# Patient Record
Sex: Female | Born: 1985 | Race: White | Hispanic: No | Smoking: Never smoker
Health system: Southern US, Community
[De-identification: ages and names within clinical notes are randomized; demographics above are authoritative.]

## PROBLEM LIST (undated history)

## (undated) DIAGNOSIS — M069 Rheumatoid arthritis, unspecified: Secondary | ICD-10-CM

## (undated) DIAGNOSIS — T7840XA Allergy, unspecified, initial encounter: Secondary | ICD-10-CM

## (undated) DIAGNOSIS — L4050 Arthropathic psoriasis, unspecified: Secondary | ICD-10-CM

## (undated) DIAGNOSIS — F32A Depression, unspecified: Secondary | ICD-10-CM

## (undated) DIAGNOSIS — K219 Gastro-esophageal reflux disease without esophagitis: Secondary | ICD-10-CM

## (undated) DIAGNOSIS — F419 Anxiety disorder, unspecified: Secondary | ICD-10-CM

## (undated) DIAGNOSIS — R7303 Prediabetes: Secondary | ICD-10-CM

## (undated) HISTORY — DX: Gastro-esophageal reflux disease without esophagitis: K21.9

## (undated) HISTORY — DX: Allergy, unspecified, initial encounter: T78.40XA

## (undated) HISTORY — PX: COSMETIC SURGERY: SHX468

## (undated) HISTORY — DX: Anxiety disorder, unspecified: F41.9

## (undated) HISTORY — DX: Arthropathic psoriasis, unspecified: L40.50

## (undated) HISTORY — DX: Prediabetes: R73.03

## (undated) HISTORY — DX: Rheumatoid arthritis, unspecified: M06.9

## (undated) HISTORY — DX: Depression, unspecified: F32.A

## (undated) HISTORY — PX: LEEP: SHX91

## (undated) HISTORY — PX: COLPOSCOPY: SHX161

---
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
## 2004-06-06 ENCOUNTER — Other Ambulatory Visit: Admission: RE | Admit: 2004-06-06 | Discharge: 2004-06-06 | Payer: Self-pay | Admitting: Obstetrics and Gynecology

## 2005-08-14 ENCOUNTER — Other Ambulatory Visit: Admission: RE | Admit: 2005-08-14 | Discharge: 2005-08-14 | Payer: Self-pay | Admitting: Obstetrics and Gynecology

## 2006-05-28 HISTORY — PX: LAPAROSCOPY: SHX197

## 2006-08-20 ENCOUNTER — Other Ambulatory Visit: Admission: RE | Admit: 2006-08-20 | Discharge: 2006-08-20 | Payer: Self-pay | Admitting: Obstetrics and Gynecology

## 2007-04-15 ENCOUNTER — Encounter: Admission: RE | Admit: 2007-04-15 | Discharge: 2007-04-15 | Payer: Self-pay | Admitting: Family Medicine

## 2009-04-12 ENCOUNTER — Ambulatory Visit (HOSPITAL_COMMUNITY): Admission: RE | Admit: 2009-04-12 | Discharge: 2009-04-12 | Payer: Self-pay | Admitting: Obstetrics and Gynecology

## 2010-04-12 ENCOUNTER — Inpatient Hospital Stay (HOSPITAL_COMMUNITY): Admission: AD | Admit: 2010-04-12 | Discharge: 2010-04-15 | Payer: Self-pay | Admitting: Obstetrics and Gynecology

## 2010-04-15 ENCOUNTER — Encounter
Admission: RE | Admit: 2010-04-15 | Discharge: 2010-05-10 | Payer: Self-pay | Source: Home / Self Care | Attending: Obstetrics and Gynecology | Admitting: Obstetrics and Gynecology

## 2010-08-08 LAB — CBC
HCT: 35 % — ABNORMAL LOW (ref 36.0–46.0)
Hemoglobin: 8.6 g/dL — ABNORMAL LOW (ref 12.0–15.0)
MCH: 33.7 pg (ref 26.0–34.0)
MCV: 97.7 fL (ref 78.0–100.0)
MCV: 98.7 fL (ref 78.0–100.0)
Platelets: 154 10*3/uL (ref 150–400)
Platelets: 216 10*3/uL (ref 150–400)
RBC: 2.55 MIL/uL — ABNORMAL LOW (ref 3.87–5.11)
RBC: 3.58 MIL/uL — ABNORMAL LOW (ref 3.87–5.11)
WBC: 10.8 10*3/uL — ABNORMAL HIGH (ref 4.0–10.5)
WBC: 7.9 10*3/uL (ref 4.0–10.5)

## 2010-08-08 LAB — AMNISURE RUPTURE OF MEMBRANE (ROM) NOT AT ARMC: Amnisure ROM: POSITIVE

## 2010-08-30 LAB — CBC
HCT: 39 % (ref 36.0–46.0)
Hemoglobin: 13.2 g/dL (ref 12.0–15.0)
MCV: 96.5 fL (ref 78.0–100.0)
Platelets: 355 10*3/uL (ref 150–400)
WBC: 6.5 10*3/uL (ref 4.0–10.5)

## 2010-10-13 NOTE — Op Note (Signed)
NAMESUMIYA, Rhonda Baker                       ACCOUNT NO.:  [id number]   MEDICAL RECORD NO.:  [id number]                   PATIENT TYPE:  AMB   LOCATION:  SDC                                  FACILITY:  [facility]   PHYSICIAN:  Georgina Peer, M.D.              DATE OF BIRTH:  04/18/1986   DATE OF PROCEDURE:  09/11/2002  DATE OF DISCHARGE:                                 OPERATIVE REPORT   PREOPERATIVE DIAGNOSES:  1. Left lower quadrant pain  2. Left ovarian cyst.   POSTOPERATIVE DIAGNOSES:  1. Left simple ovarian cyst.  2. 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   OPERATION PERFORMED:  1. Diagnostic laparoscopy with biopsy of peritoneum.  2. Fulguration of peritoneal endometriosis.  3. Left ovarian cystotomy.   SURGEON:  Georgina Peer, M.D.   ANESTHESIA:  General.   ANESTHESIOLOGIST:  Raul Del, M.D.   ESTIMATED BLOOD LOSS:  Less than 25 cc.   FINDINGS:  Simple left ovarian cyst, 2 cm in diameter with clear fluid in  it.  Implants of endometriosis at  cul-de-sac and pelvic peritoneum.   INDICATIONS:  An 25 year old gravida 0 with left lower quadrant pain.  A  left ovarian cyst, which was resolving based on ultrasound, but continued  pelvic pain.  She is brought in for evaluation.  After informed consent,  understanding the risks and complications of laparoscopy including:  bowel,  bladder or vascular injury, normal findings which would not explain her  pain, anesthetic complications  and skin and wound infections; she accepted  these and was willing to proceed.   DESCRIPTION OF PROCEDURE:  The patient was taken to the operating room,  placed in the dorsal lithotomy position, given a general anesthetic by Dr.  Tacy Dura.  Her abdomen, perineum and vagina were prepped and draped in the  normal sterile fashion.  She then had her bladder emptied with a catheter.   Bimanual examination revealed an anterior uterus with no adnexal masses.  A  uterine manipulator was placed  into the cervix, and other instruments  removed.  A vertical subumbilical incision was made and a suprapubic  incision was made, after 2 cc of 0.25% Marcaine was injected into the skin.  After the incisions were made a Veress needle was placed into the abdominal  peritoneal cavity, and 3 L of carbon dioxide gas was insufflated under low  pressures -- creating a pneumoperitoneum.  Laparoscopic trocar and sleeve  were then introduced, and under direct vision a 5 mm suprapubic port was  introduced through that incision suprapubicly.   The following pelvic findings were noted.  There appeared to be no injury  from the laparoscopic trocar placement.  The upper abdomen and appendix  appeared normal.  The liver and diaphragm appeared normal.  The colon and  small intestine appeared normal.  The anterior surface and bladder flap of  the uterus appeared normal.  Tubes bilaterally appeared normal.  The right  ovary appeared normal.  The right ovarian fossae appeared normal.  There  were implants of endometriosis in various stages, from powder burn spots to  red blebs to clear blebs, around and between the uterosacral ligaments and  cul-de-sac.  There was some scarring in the cul-de-sac and retraction of it.  The left ovary contained a 2 cm simple-appearing cyst, but was not adherent  to any of the structures.  A biopsy was taken of endometriosis in the cul-de-  sac on the right uterosacral ligament.  Then the harmonic scalpel with the  dissecting hook was used to ablate all endometrial implants, powder burn  spots, clear and red blebs, scarring of the peritoneum.  The peritoneum was  elevated over the ureters by hydrodissection, and was freed from the  ureters.  The uterosacral ligaments were cauterized and the stripes issue  around the uterosacral ligaments was divided.  A cystotomy of the left  ovarian cyst was performed with a harmonic scalpel, and clear fluid egressed  from the cyst.  The cyst  was completely deflated.  At this point photo  documentation was made; 10 cc of 0.25% Marcaine was placed into the cul-de-  sac over the dissected area.  Sponge, needle and instrument counts were  correct.  The ports were removed.  The skin incisions were closed with 4-0  Dexon.  The patient received Toradol IV and was returned to the recovery  area in stable condition.                                               Georgina Peer, M.D.    JPN/MEDQ  D:  09/11/2002  T:  09/12/2002  Job:  [id number]   cc:   Bryan Lemma. Manus Gunning, M.D.  [address] [place]  [place] [address]  Fax: [phone]

## 2019-09-15 ENCOUNTER — Emergency Department (HOSPITAL_COMMUNITY)
Admission: EM | Admit: 2019-09-15 | Discharge: 2019-09-16 | Disposition: A | Discharge status: Home / Self Care | Payer: 59 | Attending: Emergency Medicine | Admitting: Emergency Medicine

## 2019-09-15 ENCOUNTER — Encounter (HOSPITAL_COMMUNITY): Payer: Self-pay

## 2019-09-15 ENCOUNTER — Other Ambulatory Visit: Payer: Self-pay

## 2019-09-15 DIAGNOSIS — R42 Dizziness and giddiness: Secondary | ICD-10-CM | POA: Insufficient documentation

## 2019-09-15 DIAGNOSIS — R609 Edema, unspecified: Secondary | ICD-10-CM | POA: Insufficient documentation

## 2019-09-15 DIAGNOSIS — R202 Paresthesia of skin: Secondary | ICD-10-CM | POA: Diagnosis not present

## 2019-09-15 DIAGNOSIS — G4489 Other headache syndrome: Secondary | ICD-10-CM | POA: Diagnosis not present

## 2019-09-15 DIAGNOSIS — R519 Headache, unspecified: Secondary | ICD-10-CM | POA: Diagnosis present

## 2019-09-15 LAB — CBC
HCT: 39.7 % (ref 36.0–46.0)
Hemoglobin: 13 g/dL (ref 12.0–15.0)
MCH: 32.3 pg (ref 26.0–34.0)
MCHC: 32.7 g/dL (ref 30.0–36.0)
MCV: 98.5 fL (ref 80.0–100.0)
Platelets: 436 10*3/uL — ABNORMAL HIGH (ref 150–400)
RBC: 4.03 MIL/uL (ref 3.87–5.11)
RDW: 13.2 % (ref 11.5–15.5)
WBC: 11 10*3/uL — ABNORMAL HIGH (ref 4.0–10.5)
nRBC: 0 % (ref 0.0–0.2)

## 2019-09-15 LAB — BASIC METABOLIC PANEL
Anion gap: 12 (ref 5–15)
BUN: 6 mg/dL (ref 6–20)
CO2: 23 mmol/L (ref 22–32)
Calcium: 9.4 mg/dL (ref 8.9–10.3)
Chloride: 100 mmol/L (ref 98–111)
Creatinine, Ser: 0.8 mg/dL (ref 0.44–1.00)
GFR calc Af Amer: >60 mL/min (ref >60)
GFR calc non Af Amer: >60 mL/min (ref >60)
Glucose, Bld: 106 mg/dL — ABNORMAL HIGH (ref 70–99)
Potassium: 3.5 mmol/L (ref 3.5–5.1)
Sodium: 135 mmol/L (ref 135–145)

## 2019-09-15 LAB — I-STAT BETA HCG BLOOD, ED (MC, WL, AP ONLY): I-stat hCG, quantitative: <5 m[IU]/mL (ref <5)

## 2019-09-15 MED ORDER — DIPHENHYDRAMINE HCL 50 MG/ML IJ SOLN
25.0000 mg | Freq: Once | INTRAMUSCULAR | Status: AC
  Administered 2019-09-15: 25 mg via INTRAVENOUS
  Filled 2019-09-15: qty 1

## 2019-09-15 MED ORDER — SODIUM CHLORIDE 0.9 % IV BOLUS (SEPSIS)
1000.0000 mL | Freq: Once | INTRAVENOUS | Status: AC
Start: 2019-09-15 — End: 2019-09-16
  Administered 2019-09-15: 1000 mL via INTRAVENOUS

## 2019-09-15 MED ORDER — METOCLOPRAMIDE HCL 5 MG/ML IJ SOLN
10.0000 mg | Freq: Once | INTRAMUSCULAR | Status: AC
  Administered 2019-09-15: 10 mg via INTRAVENOUS
  Filled 2019-09-15: qty 2

## 2019-09-15 NOTE — ED Triage Notes (Signed)
Pt reports bilateral feet numbness for the past 2 months but noticed more tingling spreading up her right leg and swelling to right ankle. No injury or trauma. Pt ambulatory.

## 2019-09-15 NOTE — ED Provider Notes (Signed)
[facility] Provider Note   CSN: [id number] Arrival date & time: 09/15/19  1407     History Chief Complaint  Patient presents with  . Numbness  . Headache    Rhonda Baker is a 34 y.o. female.  The history is provided by the patient.  Neurologic Problem This is a chronic problem. The current episode started more than 1 week ago. The problem occurs daily. The problem has been rapidly worsening. Associated symptoms include headaches. Pertinent negatives include no chest pain, no abdominal pain and no shortness of breath. Nothing aggravates the symptoms. Nothing relieves the symptoms. She has tried nothing for the symptoms.  Patient presents with numbness.  She reports she has had bilateral foot numbness for up to 2 months.  She reports she cannot feel hot or cold in her feet for months  she has been taking medication prescribed by homeopathic physician but has not improved She reports today while at work she began having worsening numbness in her right leg extending to her buttocks.  No new weakness.  No incontinence.  She also reports she feels that her both of her legs are swollen. She also reports headaches and brief blurred vision. No chest pain or shortness of breath.     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 CESAREAN SECTION     2011     OB History   No obstetric history on file.     No family history on file.  Social History   Tobacco Use  . Smoking status: Not on file  Substance Use Topics  . Alcohol use: Not on file  . Drug use: Not on file    Home Medications Prior to Admission medications   Not on File    Allergies    Percocet [oxycodone-acetaminophen]  Review of Systems   Review of Systems  Constitutional: Negative for fever.  Respiratory: Negative for shortness of breath.   Cardiovascular: Positive for leg swelling. Negative for chest  pain.  Gastrointestinal: Negative for abdominal pain.  Musculoskeletal: Negative for back pain and neck pain.  Neurological: Positive for dizziness, numbness and headaches. Negative for weakness.  All other systems reviewed and are negative.   Physical Exam Updated Vital Signs BP (!) 135/94 (BP Location: Left Arm)   Pulse (!) 105   Temp 98 F (36.7 C) (Oral)   Resp 16   Ht 1.626 m (5\' 4" )   Wt 90.7 kg   SpO2 96%   BMI 34.33 kg/m   Physical Exam CONSTITUTIONAL: Well developed/well nourished HEAD: Normocephalic/atraumatic EYES: EOMI/PERRL, no nystagmus, no ptosis ENMT: Mucous membranes moist NECK: supple no meningeal signs, no bruits CV: S1/S2 noted, no murmurs/rubs/gallops noted LUNGS: Lungs are clear to auscultation bilaterally, no apparent distress ABDOMEN: soft, nontender, no rebound or guarding GU:no cva tenderness NEURO:Awake/alert, face symmetric, no arm or leg drift is noted Equal 5/5 strength with shoulder abduction, elbow flex/extension, wrist flex/extension in upper extremities and equal hand grips bilaterally Equal 5/5 strength with hip flexion,knee flex/extension, foot dorsi/plantar flexion Cranial nerves 3/4/5/6/12/03/08/11/12 tested and intact Gait normal without ataxia There is no numbness noted to either arm Patient reports "tingling "to the plantar surface of both feet.  She reports tingling on the posterior surface of the right thigh and calf.  She reports tingling on the posterior side of the left calf no clonus bilaterally, plantar reflex appropriate (toes downgoing)  Equal patellar/achilles reflex  noted in bilateral lower extremities - no hypo-reflexia EXTREMITIES: pulses normal, full ROM, symmetric mild edema bilateral lower extremities.  No calf tenderness. SKIN: warm, color normal PSYCH: no abnormalities of mood noted  ED Results / Procedures / Treatments   Labs (all labs ordered are listed, but only abnormal results are displayed) Labs Reviewed   BASIC METABOLIC PANEL - Abnormal; Notable for the following components:      Result Value   Glucose, Bld 106 (*)    All other components within normal limits  CBC - Abnormal; Notable for the following components:   WBC 11.0 (*)    Platelets 436 (*)    All other components within normal limits  I-STAT BETA HCG BLOOD, ED ([facility], [facility], [facility] ONLY)  I-STAT BETA HCG BLOOD, ED ([facility], [facility], [facility] ONLY)    EKG EKG Interpretation  Date/Time:  Wednesday September 16 2019 00:02:38 EDT Ventricular Rate:  95 PR Interval:    QRS Duration: 82 QT Interval:  363 QTC Calculation: 457 R Axis:   80 Text Interpretation: Sinus rhythm No previous ECGs available Confirmed by Zadie Rhine ([id number]) on 09/16/2019 12:10:17 AM   Radiology No results found.  Procedures Procedures  Medications Ordered in ED Medications  ketorolac (TORADOL) 30 MG/ML injection 30 mg (has no administration in time range)  sodium chloride 0.9 % bolus 1,000 mL (0 mLs Intravenous Stopped 09/16/19 0046)  metoCLOPramide (REGLAN) injection 10 mg (10 mg Intravenous Given 09/15/19 2358)  diphenhydrAMINE (BENADRYL) injection 25 mg (25 mg Intravenous Given 09/15/19 2357)    ED Course  I have reviewed the triage vital signs and the nursing notes.  Pertinent labs  results that were available during my care of the patient were reviewed by me and considered in my medical decision making (see chart for details).    MDM Rules/Calculators/A&P                       12:13 AM Patient presents for numbness is been ongoing for at least 2 months but worse today.  Aside from reported numbness, no focal neuro deficits, she is ambulatory. Given chronicity of symptoms, I feel she is appropriate for outpatient management.  Urgent referral to neurology has been placed. No signs of acute stroke.  No signs of acute myelopathy I did inform patient that MS is in the differential will need close follow-up   This patient presents to the ED for concern of numbness,  this involves an extensive number of treatment options, and is a complaint that carries with it a high risk of complications and morbidity.  The differential diagnosis includes stroke, neuropathy, multiple sclerosis, myelopathy   Lab Tests:   I Ordered, reviewed, and interpreted labs, which included metabolic panel, complete blood count  Medicines ordered:   I ordered medication fluids/Reglan/Benadryl/toradol for dizziness and headache   Reevaluation:  After the interventions stated above, I reevaluated the patient and found patient is improved  . 1:13 AM . Patient feeling improved, no acute distress. Speech is clear, no focal weakness. Reports dizziness is resolved. She was ambulatory without difficulty. I have low suspicion for acute neurologic emergency at this time. I feel she is appropriate for outpatient management, neurology referral has been placed.  Final Clinical Impression(s) / ED Diagnoses Final diagnoses:  Other headache syndrome  Paresthesias  Peripheral edema    Rx / DC Orders ED Discharge Orders         Ordered    Ambulatory referral to Neurology  Comments: An appointment is requested in approximately: 1 week   09/15/19 2338           Zadie Rhine, MD 09/16/19 [phone]

## 2019-09-16 MED ORDER — KETOROLAC TROMETHAMINE 30 MG/ML IJ SOLN
30.0000 mg | Freq: Once | INTRAMUSCULAR | Status: AC
  Administered 2019-09-16: 30 mg via INTRAVENOUS
  Filled 2019-09-16: qty 1

## 2019-09-16 NOTE — ED Notes (Signed)
Patient verbalizes understanding of discharge instructions. Opportunity for questioning and answers were provided. Armband removed by staff, pt discharged from ED stable & ambulatory  

## 2019-09-16 NOTE — ED Notes (Signed)
 left in bolus bag, pt states that she is "not as nauseus" & "feeling better," denies headache at this time.

## 2019-09-18 ENCOUNTER — Encounter: Payer: Self-pay | Admitting: Neurology

## 2019-09-22 ENCOUNTER — Other Ambulatory Visit: Payer: Self-pay | Admitting: Physician Assistant

## 2019-09-22 DIAGNOSIS — R202 Paresthesia of skin: Secondary | ICD-10-CM

## 2019-10-14 ENCOUNTER — Other Ambulatory Visit: Payer: Self-pay | Admitting: Physician Assistant

## 2019-10-17 ENCOUNTER — Other Ambulatory Visit: Payer: 59

## 2019-10-17 ENCOUNTER — Ambulatory Visit
Admission: RE | Admit: 2019-10-17 | Discharge: 2019-10-17 | Disposition: A | Discharge status: Home / Self Care | Payer: 59 | Source: Ambulatory Visit | Attending: Physician Assistant | Admitting: Physician Assistant

## 2019-10-17 ENCOUNTER — Inpatient Hospital Stay: Admission: RE | Admit: 2019-10-17 | Payer: 59 | Source: Ambulatory Visit

## 2019-10-17 DIAGNOSIS — R202 Paresthesia of skin: Secondary | ICD-10-CM

## 2019-10-17 MED ORDER — GADOBENATE DIMEGLUMINE 529 MG/ML IV SOLN
20.0000 mL | Freq: Once | INTRAVENOUS | Status: AC | PRN
  Administered 2019-10-17: 20 mL via INTRAVENOUS

## 2019-10-24 ENCOUNTER — Other Ambulatory Visit: Payer: 59

## 2019-11-18 NOTE — Progress Notes (Signed)
NEUROLOGY CONSULTATION NOTE  DEBE ANFINSON MRN: [id number] DOB: Oct 27, 1985  Referring provider: Ripley Fraise, MD (ED referral) Primary care provider: Lennie Odor, PA  Reason for consult:  paresthesias  HISTORY OF PRESENT ILLNESS: Rhonda Baker is a 34 year old right-handed female who presents for headache and paresthesias.  History supplemented by ED notes.  She is accompanied by her cousin.  She was treated for cellulitis in both legs after she fell and scraped her knees in October 2020.  Before Christmas, she began experiencing numbness on the heels of both feet.  Subsequently, it spread to involve her toes and entire feet and at times radiates up to below the knees.  No skin discoloration.  She subsequently developed tingling and sometimes stinging in her feet as well.  Sunlight exposure triggers the pain and numbness.  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  No weakness or radicular pain in the legs.  One time, she noted numbness from her left scapula down her left lateral arm and into the 4th and 5th digits, but otherwise no involvement in upper extremities.  She also has developed diffuse body aches.  She presented to the [facility] ED on 09/15/2019 for further evaluation.  Since she endorsed a headache at that time, she was treated with a migraine cocktail and discharged in stable condition.  She then went to the ED at [facility] on 09/24/2019.  MRI of brain with and without contrast showed minimal nonspecific punctate foci in the cerebral white matter without abnormal enhancement.  MRI of cervical spine with and without contrast showed no cord abnormality and only minor degenerative changes with no significant canal or neural foraminal stenosis.  Sed rate was 19 and TSH 1.712.  She was treated with IV toradol and discharged in stable condition.  She is  scheduled to see endocrinology.    She has not been exposed to any heavy metals.  Her grandfather had diabetic neuropathy but otherwise no family history of neuropathy.  She reports no new medications since onset of symptoms.     PAST MEDICAL HISTORY: No past medical history on file.  PAST SURGICAL HISTORY: Past Surgical History:  Procedure Laterality Date  . CESAREAN SECTION     2011    MEDICATIONS: Current Outpatient Medications on File Prior to Visit  Medication Sig Dispense Refill  . amphetamine-dextroamphetamine (ADDERALL) 30 MG tablet Take 30 mg by mouth 2 (two) times daily.    . citalopram (CELEXA) 20 MG tablet Take 20 mg by mouth at bedtime.    . clonazePAM (KLONOPIN) 1 MG tablet Take 2 mg by mouth at bedtime.    Marland Kitchen doxepin (SINEQUAN) 75 MG capsule Take 75 mg by mouth at bedtime.    . lamoTRIgine (LAMICTAL) 200 MG tablet Take 200 mg by mouth at bedtime.    Marland Kitchen PRESCRIPTION MEDICATION Take 1 tablet by mouth daily. Birth control    . ziprasidone (GEODON) 60 MG capsule Take 60 mg by mouth at bedtime.     No current facility-administered medications on file prior to visit.    ALLERGIES: Allergies  Allergen Reactions  . Percocet [Oxycodone-Acetaminophen]     hives    FAMILY HISTORY: No family history on file.  SOCIAL HISTORY: Social History   Socioeconomic History  . Marital status: Married    Spouse name: Not  on file  . Number of children: Not on file  . Years of education: Not on file  . Highest education level: Not on file  Occupational History  . Not on file  Tobacco Use  . Smoking status: Not on file  Substance and Sexual Activity  . Alcohol use: Not on file  . Drug use: Not on file  . Sexual activity: Not on file  Other Topics Concern  . Not on file  Social History Narrative  . Not on file   Social Determinants of Health   Financial Resource Strain:   . Difficulty of Paying Living Expenses:   Food Insecurity:   . Worried About Brewing technologist in the Last Year:   . Barista in the Last Year:   Transportation Needs:   . Freight forwarder (Medical):   Marland Kitchen Lack of Transportation (Non-Medical):   Physical Activity:   . Days of Exercise per Week:   . Minutes of Exercise per Session:   Stress:   . Feeling of Stress :   Social Connections:   . Frequency of Communication with Friends and Family:   . Frequency of Social Gatherings with Friends and Family:   . Attends Religious Services:   . Active Member of Clubs or Organizations:   . Attends Banker Meetings:   Marland Kitchen Marital Status:   Intimate Partner Violence:   . Fear of Current or Ex-Partner:   . Emotionally Abused:   Marland Kitchen Physically Abused:   . Sexually Abused:     PHYSICAL EXAM: Blood pressure 120/83, pulse 99, resp. rate 18, height 5\' 4"  (1.626 m), weight 226 lb (102.5 kg), SpO2 100 %. General: No acute distress.  Patient appears well-groomed.   Head:  Normocephalic/atraumatic Eyes:  fundi examined but not visualized Neck: supple, no paraspinal tenderness, full range of motion Back: No paraspinal tenderness Heart: regular rate and rhythm Lungs: Clear to auscultation bilaterally. Vascular: No carotid bruits. Neurological Exam: Mental status: alert and oriented to person, place, and time, recent and remote memory intact, fund of knowledge intact, attention and concentration intact, speech fluent and not dysarthric, language intact. Cranial nerves: CN I: not tested CN II: pupils equal, round and reactive to light, visual fields intact CN III, IV, VI:  full range of motion, no nystagmus, no ptosis CN V: facial sensation intact CN VII: upper and lower face symmetric CN VIII: hearing intact CN IX, X: gag intact, uvula midline CN XI: sternocleidomastoid and trapezius muscles intact CN XII: tongue midline Bulk & Tone: normal, no fasciculations. Motor:  5/5 throughout  Sensation:  Pinprick sensation reduced in heels and ball of feet and patchy  distribution over dorsum and lower legs below knees; vibratory sensation intact.  Deep Tendon Reflexes:  2+ throughout, toes downgoing.   Finger to nose testing:  Without dysmetria.   Heel to shin:  Without dysmetria.   Gait:  Normal station and stride.  Able to turn and tandem walk. Romberg negative.  IMPRESSION: Numbness and tingling in lower extremities.  Will evaluate for secondary causes of paresthesias and neuropathy.    PLAN: 1.  Check ANA, RF, SSA/SSB antibodies, B12, folate, B6, Hgb A1c and SPEP/IFE 2.  NCV-EMG lower extremities 3.  Follow up after testing.  Further recommendations pending results.  Thank you for allowing me to take part in the care of this patient.  , DO  CC:  Shon Millet, PA

## 2019-11-19 ENCOUNTER — Telehealth: Payer: Self-pay

## 2019-11-19 ENCOUNTER — Other Ambulatory Visit (INDEPENDENT_AMBULATORY_CARE_PROVIDER_SITE_OTHER): Payer: No Typology Code available for payment source

## 2019-11-19 ENCOUNTER — Other Ambulatory Visit: Payer: Self-pay

## 2019-11-19 ENCOUNTER — Ambulatory Visit: Payer: No Typology Code available for payment source | Admitting: Neurology

## 2019-11-19 ENCOUNTER — Encounter: Payer: Self-pay | Admitting: Neurology

## 2019-11-19 VITALS — BP 120/83 | HR 99 | Resp 18 | Ht 64.0 in | Wt 226.0 lb

## 2019-11-19 DIAGNOSIS — R739 Hyperglycemia, unspecified: Secondary | ICD-10-CM

## 2019-11-19 DIAGNOSIS — R202 Paresthesia of skin: Secondary | ICD-10-CM

## 2019-11-19 DIAGNOSIS — R200 Anesthesia of skin: Secondary | ICD-10-CM

## 2019-11-19 LAB — B12 AND FOLATE PANEL
Folate: >24.8 ng/mL (ref >5.9)
Vitamin B-12: 541 pg/mL (ref 211–911)

## 2019-11-19 LAB — HEMOGLOBIN A1C: Hgb A1c MFr Bld: 5.8 % (ref 4.6–6.5)

## 2019-11-19 LAB — TSH: TSH: 1.87 u[IU]/mL (ref 0.35–4.50)

## 2019-11-19 NOTE — Progress Notes (Signed)
EMG Referral Faxed over to [facility]. Per Pt.

## 2019-11-19 NOTE — Patient Instructions (Addendum)
1.  Check ANA, RF, SSA/SSB antibodies, B12, folate, B6, HGB A1c, SPEP/IFE 2.  Nerve conduction study of lower extremities 3.  Follow up after testing

## 2019-11-20 LAB — ANA W/REFLEX: Anti Nuclear Antibody (ANA): NEGATIVE

## 2019-11-23 LAB — VITAMIN B6: Vitamin B6: 40 ng/mL — ABNORMAL HIGH (ref 2.1–21.7)

## 2019-11-23 LAB — IMMUNOFIXATION ELECTROPHORESIS
IgG (Immunoglobin G), Serum: 1203 mg/dL (ref 600–1640)
IgM, Serum: 69 mg/dL (ref 50–300)
Immunoglobulin A: 269 mg/dL (ref 47–310)

## 2019-11-23 LAB — PROTEIN ELECTROPHORESIS, SERUM
Albumin ELP: 4.1 g/dL (ref 3.8–4.8)
Alpha 1: 0.4 g/dL — ABNORMAL HIGH (ref 0.2–0.3)
Alpha 2: 0.8 g/dL (ref 0.5–0.9)
Beta 2: 0.4 g/dL (ref 0.2–0.5)
Beta Globulin: 0.5 g/dL (ref 0.4–0.6)
Gamma Globulin: 1.1 g/dL (ref 0.8–1.7)
Total Protein: 7.3 g/dL (ref 6.1–8.1)

## 2019-11-23 LAB — RHEUMATOID FACTOR: Rheumatoid fact SerPl-aCnc: <14 IU/mL (ref <14)

## 2019-11-23 LAB — SJOGREN'S SYNDROME ANTIBODS(SSA + SSB)
SSA (Ro) (ENA) Antibody, IgG: <1 AI
SSB (La) (ENA) Antibody, IgG: <1 AI

## 2019-11-23 NOTE — Telephone Encounter (Signed)
Left VM for pt relaying lab results as requested by Dr Everlena Cooper as follows - (Labs looking for causes of neuropathy are largely unremarkable. The vitamin B6 level is slightly elevated. If she is taking any supplements with B6, I would discontinue it.) Asked pt to call back if she has any questions.

## 2019-11-27 ENCOUNTER — Ambulatory Visit: Payer: No Typology Code available for payment source | Admitting: Family Medicine

## 2019-11-27 ENCOUNTER — Telehealth: Payer: Self-pay | Admitting: Neurology

## 2019-11-27 ENCOUNTER — Encounter: Payer: Self-pay | Admitting: Family Medicine

## 2019-11-27 ENCOUNTER — Other Ambulatory Visit: Payer: Self-pay

## 2019-11-27 VITALS — BP 132/86 | HR 100 | Temp 97.2°F | Resp 18 | Ht 64.0 in | Wt 226.1 lb

## 2019-11-27 DIAGNOSIS — R5383 Other fatigue: Secondary | ICD-10-CM | POA: Diagnosis not present

## 2019-11-27 DIAGNOSIS — E559 Vitamin D deficiency, unspecified: Secondary | ICD-10-CM | POA: Diagnosis not present

## 2019-11-27 DIAGNOSIS — R200 Anesthesia of skin: Secondary | ICD-10-CM | POA: Diagnosis not present

## 2019-11-27 DIAGNOSIS — M2550 Pain in unspecified joint: Secondary | ICD-10-CM | POA: Insufficient documentation

## 2019-11-27 DIAGNOSIS — R202 Paresthesia of skin: Secondary | ICD-10-CM | POA: Insufficient documentation

## 2019-11-27 DIAGNOSIS — F5101 Primary insomnia: Secondary | ICD-10-CM

## 2019-11-27 DIAGNOSIS — I10 Essential (primary) hypertension: Secondary | ICD-10-CM | POA: Insufficient documentation

## 2019-11-27 MED ORDER — ESZOPICLONE 1 MG PO TABS: 1.0000 mg | ORAL_TABLET | Freq: Every evening | ORAL | 0 refills | Status: DC | PRN

## 2019-11-27 NOTE — Assessment & Plan Note (Signed)
Extensive labs reviewed.  Some labs checked today.  Pending results will treat as appropriate.

## 2019-11-27 NOTE — Progress Notes (Signed)
Subjective:  Patient ID: Rhonda Baker, female    DOB: 1986/02/07  Age: 34 y.o. MRN: [id number]  CC:  Chief Complaint  Patient presents with  . New Patient (Initial Visit)    New Pt appt hasnt been able to feel feet since christmas gained 60lbs in the last few months small cuts lead to infections       HPI  HPI   Rhonda Baker is a very pleasant 34 year old female who presents today to establish care. She has a history that includes anxiety, depression, constipation.  More recently she has had difficulties with weight placing her in the obesity range.   She presents today for several concerns that have been ongoing over the 6 months or so.  She reports that she has seen several specialists and going to the emergency room a few times, as well-notes reviewed.  There is question as to whether or not she has an autoimmune disorder that has not been identified at this time.  She reports everything began last summer after she was stung by a bee and developed cellulitis.  A few months later she was at the beach she fell and skinned her knees and she developed the infection again.  Several months after that closer to the winter time she started noticing numbness and tingling in the bottom of her feet.  She has had the numbness and tingling traveling up to her calf and knee area.  She has tried to wear compression socks to help with this along with swelling that she is started to experience.  She reports that this does not help very much and she still has the swelling.  She does try to elevate her legs when she is at home. She has had several MRIs to rule out various causes, including MS as she has a family history of this.  However she has not had a lumbar puncture.  She is now being followed by neurology-see below HPI from note.  It appears that they had order the EMG for her to have as well.  She has an upcoming appointment for endocrinology-TSH and A1c have been normal. She is also being seen by  [facility] to help manage high-level anxiety and depression secondary to a very stressful divorce. She reports doing better with this and is willing to have medication adjustments, as several of her current medications have the side effect of paresthesias.  She has not had a referral to rheumatology.  However her ANA, RA, RPR, Sjogren's and sed rate have all been within normal ranges.  There could be questionable undiagnosed autoimmune secondary to not having a full set criteria.  She is willing to have more labs addressed to see if there are any other findings.  It is noted that she did have an elevation in her B6 which can also lead to paresthesias along with some minor issues.  And she does report that sunlight is a trigger for her to have splotchy redness like rash and swelling.  She is the primary custodial caregiver of her son.  She [place].  They have 2 dogs that live in the house. She has a supportive family and friends network. 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  She saw neurology last week. Note HPI details:    She was treated for cellulitis in both legs after she fell and scraped her knees in October 2020.  Before Christmas, she  began experiencing numbness on the heels of both feet.  Subsequently, it spread to involve her toes and entire feet and at times radiates up to below the knees.  No skin discoloration.  She subsequently developed tingling and sometimes stinging in her feet as well.  Sunlight exposure triggers the pain and numbness.  She has some non-radiating back pain and notes swelling in her feet and ankles at the end of the day, but she is a Sales executive and is on her feet all day.  No weakness or radicular pain in the legs.  One time, she noted numbness from her left scapula down her left lateral arm and into the 4th and 5th digits, but otherwise no involvement in upper extremities.  She also has developed  diffuse body aches.  She presented to [facility] ED on 09/15/2019 for further evaluation.  Since she endorsed a headache at that time, she was treated with a migraine cocktail and discharged in stable condition.  She then went to the ED at [facility] on 09/24/2019.  MRI of brain with and without contrast showed minimal nonspecific punctate foci in the cerebral white matter without abnormal enhancement.  MRI of cervical spine with and without contrast showed no cord abnormality and only minor degenerative changes with no significant canal or neural foraminal stenosis.  Sed rate was 19 and TSH 1.712.  She was treated with IV toradol and discharged in stable condition.  She is scheduled to see endocrinology.    She has not been exposed to any heavy metals.  Her grandfather had diabetic neuropathy but otherwise no family history of neuropathy.  She reports no new medications since onset of symptoms.  Today patient denies signs and symptoms of COVID 19 infection including fever, chills, cough, shortness of breath, and headache. Past Medical, Surgical, Social History, Allergies, and Medications have been Reviewed.   Past Medical History:  Diagnosis Date  . Anxiety    Phreesia 11/25/2019  . Depression    Phreesia 11/25/2019    Current Meds  Medication Sig  . amphetamine-dextroamphetamine (ADDERALL) 30 MG tablet Take 30 mg by mouth 2 (two) times daily.  Marland Kitchen CHARLOTTE 24 FE 1-20 MG-MCG(24) CHEW Chew 1 tablet by mouth daily.  . citalopram (CELEXA) 20 MG tablet Take 20 mg by mouth at bedtime.  . clonazePAM (KLONOPIN) 1 MG tablet Take 2 mg by mouth at bedtime.  Marland Kitchen doxepin (SINEQUAN) 75 MG capsule Take 75 mg by mouth at bedtime.  . lamoTRIgine (LAMICTAL) 200 MG tablet Take 200 mg by mouth at bedtime.  Marland Kitchen LINZESS 145 MCG CAPS capsule Take 145 mcg by mouth daily.  . nitrofurantoin, macrocrystal-monohydrate, (MACROBID) 100 MG capsule Take 100 mg by mouth as directed.  . ziprasidone (GEODON) 60  MG capsule Take 60 mg by mouth at bedtime.    ROS:  Review of Systems  Constitutional: Negative.        Weight gain  HENT: Negative.   Eyes: Negative.   Respiratory: Negative.   Cardiovascular: Negative.   Gastrointestinal: Positive for constipation.  Genitourinary: Negative.   Musculoskeletal: Negative.        Muscle and Joint aches  Skin: Negative.   Neurological: Positive for sensory change.  Endo/Heme/Allergies: Negative.   Psychiatric/Behavioral: The patient is nervous/anxious and has insomnia.   All other systems reviewed and are negative.    Objective:   Today's Vitals: BP 132/86 (BP Location: Right Arm, Patient Position: Sitting, Cuff Size: Normal)   Pulse 100  Temp (!) 97.2 F (36.2 C) (Temporal)   Resp 18   Ht 5\' 4"  (1.626 m)   Wt 226 lb 1.9 oz (102.6 kg)   SpO2 99%   BMI 38.81 kg/m  Vitals with BMI 11/27/2019 11/19/2019 09/16/2019  Height 5\' 4"  5\' 4"  -  Weight 226 lbs 2 oz 226 lbs -  BMI 38.79 38.77 -  Systolic 132 120 09/18/2019  Diastolic 86 83 76  Pulse 100 99 84     Physical Exam Vitals and nursing note reviewed.  Constitutional:      Appearance: Normal appearance. She is well-developed and well-groomed. She is obese.  HENT:     Head: Normocephalic and atraumatic.     Right Ear: External ear normal.     Left Ear: External ear normal.     Mouth/Throat:     Comments: Mask in place  Eyes:     General:        Right eye: No discharge.        Left eye: No discharge.     Conjunctiva/sclera: Conjunctivae normal.  Cardiovascular:     Rate and Rhythm: Normal rate and regular rhythm.     Pulses: Normal pulses.          Popliteal pulses are 2+ on the right side and 2+ on the left side.       Dorsalis pedis pulses are 2+ on the right side and 2+ on the left side.     Heart sounds: Normal heart sounds.  Pulmonary:     Effort: Pulmonary effort is normal.     Breath sounds: Normal breath sounds.  Musculoskeletal:        General: Normal range of motion.      Cervical back: Normal range of motion and neck supple.     Right lower leg: No edema.     Left lower leg: No edema.  Skin:    General: Skin is warm.  Neurological:     General: No focal deficit present.     Mental Status: She is alert and oriented to person, place, and time.  Psychiatric:        Attention and Perception: Attention and perception normal.        Mood and Affect: Mood and affect normal.        Speech: Speech normal.        Behavior: Behavior normal. Behavior is cooperative.        Thought Content: Thought content normal.        Cognition and Memory: Cognition and memory normal.        Judgment: Judgment normal.     Comments: Very pleasant in communication, good eye contact    Depression screen [facility] 2/9 11/27/2019  Decreased Interest 3  Down, Depressed, Hopeless 0  PHQ - 2 Score 3  Altered sleeping 0  Tired, decreased energy 3  Change in appetite 0  Feeling bad or failure about yourself  0  Trouble concentrating 0  Moving slowly or fidgety/restless 0  Suicidal thoughts 0  PHQ-9 Score 6  Difficult doing work/chores Somewhat difficult   GAD 7 : Generalized Anxiety Score 11/27/2019  Nervous, Anxious, on Edge 3  Control/stop worrying 3  Worry too much - different things 3  Trouble relaxing 3  Restless 0  Easily annoyed or irritable 3  Afraid - awful might happen 0  Total GAD 7 Score 15  Anxiety Difficulty Somewhat difficult     Assessment   1. Numbness and  tingling of both feet   2. Other fatigue   3. Vitamin D deficiency   4. Arthralgia, unspecified joint   5. Essential hypertension   6. Primary insomnia     Tests ordered Orders Placed This Encounter  Procedures  . VITAMIN D 25 Hydroxy (Vit-D Deficiency, Fractures)  . B. burgdorfi antibodies  . Lead, blood (adult age 53 yrs or greater)  . [name], serum  . Heavy Metals Panel, Blood  . C-reactive protein  . Cortisol, free, Serum  . Vitamin B6  . COMPLETE METABOLIC PANEL WITH GFR  . CBC with  Differential/Platelet  . PTH, Intact and Calcium     Plan: Please see assessment and plan per problem list above.   Meds ordered this encounter  Medications  . eszopiclone (LUNESTA) 1 MG TABS tablet    Sig: Take 1 tablet (1 mg total) by mouth at bedtime as needed for sleep. Take immediately before bedtime    Dispense:  30 tablet    Refill:  0    Order Specific Question:   Supervising Provider    Answer:   Kerri Perches [[id number]]    Patient to follow-up in 4 weeks   Freddy Finner, NP

## 2019-11-27 NOTE — Telephone Encounter (Signed)
Left VM for pt that order re-faxed.

## 2019-11-27 NOTE — Patient Instructions (Addendum)
I appreciate the opportunity to provide you with care for your health and wellness. Today we discussed: establish care   Follow up: 4 week by phone for med review   Labs today No referrals today  Current Med Changes:  Taper Doxepin to 75 mg, for a week. If doing ok, stop and use Lunesta  Taper Lamictal to 100 mg for 2 weeks, then 50 mg for two.  So happy to see you in person.  I hope we can sort out everything you are dealing with and get to the bottom of things.   Safe travels and I hope the memorial for your grandpa goes well.  Please continue to practice social distancing to keep you, your family, and our community safe.  If you must go out, please wear a mask and practice good handwashing.  It was a pleasure to see you and I look forward to continuing to work together on your health and well-being. Please do not hesitate to call the office if you need care or have questions about your care.  Have a wonderful day and week. With Gratitude, Tereasa Coop, DNP, AGNP-BC

## 2019-11-27 NOTE — Assessment & Plan Note (Signed)
Rhonda Baker is encouraged to maintain a well balanced diet that is low in salt. Higher end of controlled, will monitor for now She is reminded that exercise is beneficial for heart health and control of Blood pressure. 30-60 minutes daily is recommended-walking was suggested.

## 2019-11-27 NOTE — Assessment & Plan Note (Signed)
Extensive review of notes.  Extensive review of labs.  I have ordered additional testing and labs to see if there is anything that has not been addressed or covered.  Will be working at weaning off some medications that can cause some paresthesias as well.  Several ongoing processes and factors here patient is aware that this could take a little bit of time.  Overall is feeling good about the situation and the progress in which she is moving in the direction when she is moving.

## 2019-11-27 NOTE — Assessment & Plan Note (Signed)
Checking vitamin D level will treat if needed.

## 2019-11-27 NOTE — Telephone Encounter (Signed)
The following message was left with [facility] on 11/27/19 at 12:15 PM.  Caller states she was supposed to have a referral sent to [facility] for a test and it was not sent. It was supposed to be sent on the 24th of June.

## 2019-11-27 NOTE — Assessment & Plan Note (Signed)
She is never been worked up for Lyme disease however she has been bitten by ticks in the past.  She does not recall having any infections that she remembers.  Will double back and see if this could be an issue possible need for work-up from rheumatology.  Patient acknowledged agreement and understanding of the plan.

## 2019-11-27 NOTE — Assessment & Plan Note (Addendum)
I would like to see her reduce the amount of medications that she is on that are overlapping with some paresthesia issues.  She reports that she is no longer having any excessive depression or anxiety.  That these are much more controlled.  She is willing to have an adjustment that would include changing doxepin to Lunesta, Lamictal weaning off and Geodon weaning off.  Possible change of Celexa if hyponatremia continues to be an issue.  We will do weaning the tapers and not completely stop the medications this was discussed extensively in detail during office visit.  Will reduce dose of doxepin as of today.  Will reduce dose of Lamictal as a today.  We will revisit in 3 weeks via phone to see how she is sleeping.  And as long as she is not having any side effects we will continue the wean.   Patient reported understanding of signs and symptoms of withdrawal and toxicity as well as signs and symptoms of new medication.Patient acknowledged agreement and understanding of the plan.

## 2019-12-01 ENCOUNTER — Encounter: Payer: Self-pay | Admitting: Family Medicine

## 2019-12-01 ENCOUNTER — Ambulatory Visit: Payer: 59 | Admitting: Neurology

## 2019-12-03 LAB — CBC WITH DIFFERENTIAL/PLATELET
Absolute Monocytes: 500 cells/uL (ref 200–950)
Basophils Absolute: 57 cells/uL (ref 0–200)
Basophils Relative: 0.7 %
Eosinophils Absolute: 16 cells/uL (ref 15–500)
Eosinophils Relative: 0.2 %
HCT: 39.8 % (ref 35.0–45.0)
Hemoglobin: 13.3 g/dL (ref 11.7–15.5)
Lymphs Abs: 2673 cells/uL (ref 850–3900)
MCH: 32 pg (ref 27.0–33.0)
MCHC: 33.4 g/dL (ref 32.0–36.0)
MCV: 95.9 fL (ref 80.0–100.0)
MPV: 8.9 fL (ref 7.5–12.5)
Monocytes Relative: 6.1 %
Neutro Abs: 4953 cells/uL (ref 1500–7800)
Neutrophils Relative %: 60.4 %
Platelets: 438 10*3/uL — ABNORMAL HIGH (ref 140–400)
RBC: 4.15 10*6/uL (ref 3.80–5.10)
RDW: 12.3 % (ref 11.0–15.0)
Total Lymphocyte: 32.6 %
WBC: 8.2 10*3/uL (ref 3.8–10.8)

## 2019-12-03 LAB — COMPLETE METABOLIC PANEL WITH GFR
AG Ratio: 1.4 (calc) (ref 1.0–2.5)
ALT: 60 U/L — ABNORMAL HIGH (ref 6–29)
AST: 37 U/L — ABNORMAL HIGH (ref 10–30)
Albumin: 4.2 g/dL (ref 3.6–5.1)
Alkaline phosphatase (APISO): 46 U/L (ref 31–125)
BUN: 9 mg/dL (ref 7–25)
CO2: 26 mmol/L (ref 20–32)
Calcium: 9.4 mg/dL (ref 8.6–10.2)
Chloride: 103 mmol/L (ref 98–110)
Creat: 0.71 mg/dL (ref 0.50–1.10)
GFR, Est African American: 128 mL/min/{1.73_m2} (ref >=60)
GFR, Est Non African American: 110 mL/min/{1.73_m2} (ref >=60)
Globulin: 3 g/dL (calc) (ref 1.9–3.7)
Glucose, Bld: 102 mg/dL (ref 65–139)
Potassium: 4.8 mmol/L (ref 3.5–5.3)
Sodium: 137 mmol/L (ref 135–146)
Total Bilirubin: 0.4 mg/dL (ref 0.2–1.2)
Total Protein: 7.2 g/dL (ref 6.1–8.1)

## 2019-12-03 LAB — PTH, INTACT AND CALCIUM
Calcium: 9.4 mg/dL (ref 8.6–10.2)
PTH: 50 pg/mL (ref 14–64)

## 2019-12-03 LAB — HEAVY METALS PANEL, BLOOD
Arsenic: <10 mcg/L (ref <23)
Lead: <1 ug/dL (ref <5)
Mercury, B: <5 mcg/L (ref 0–10)

## 2019-12-03 LAB — CORTISOL, FREE: Cortisol Free, Ser: 0.22 ug/dL

## 2019-12-03 LAB — VITAMIN D 25 HYDROXY (VIT D DEFICIENCY, FRACTURES): Vit D, 25-Hydroxy: 20 ng/mL — ABNORMAL LOW (ref 30–100)

## 2019-12-03 LAB — B. BURGDORFI ANTIBODIES: B burgdorferi Ab IgG+IgM: <0.9 index

## 2019-12-03 LAB — C-REACTIVE PROTEIN: CRP: 34.3 mg/L — ABNORMAL HIGH (ref <8.0)

## 2019-12-03 LAB — COPPER, SERUM: Copper: 181 ug/dL — ABNORMAL HIGH (ref 70–175)

## 2019-12-03 LAB — VITAMIN B6: Vitamin B6: 14.5 ng/mL (ref 2.1–21.7)

## 2019-12-08 ENCOUNTER — Other Ambulatory Visit: Payer: Self-pay | Admitting: Family Medicine

## 2019-12-08 DIAGNOSIS — R748 Abnormal levels of other serum enzymes: Secondary | ICD-10-CM

## 2019-12-08 DIAGNOSIS — K581 Irritable bowel syndrome with constipation: Secondary | ICD-10-CM

## 2019-12-08 NOTE — Progress Notes (Signed)
Was in heavy metel panel

## 2019-12-09 ENCOUNTER — Encounter: Payer: Self-pay | Admitting: Family Medicine

## 2019-12-11 ENCOUNTER — Other Ambulatory Visit: Payer: Self-pay

## 2019-12-11 ENCOUNTER — Encounter: Payer: Self-pay | Admitting: Endocrinology

## 2019-12-11 ENCOUNTER — Ambulatory Visit: Payer: No Typology Code available for payment source | Admitting: Endocrinology

## 2019-12-11 ENCOUNTER — Encounter: Payer: Self-pay | Admitting: Family Medicine

## 2019-12-11 DIAGNOSIS — R635 Abnormal weight gain: Secondary | ICD-10-CM

## 2019-12-11 MED ORDER — DEXAMETHASONE 1 MG PO TABS: 1.0000 mg | ORAL_TABLET | ORAL | 0 refills | Status: DC

## 2019-12-11 NOTE — Progress Notes (Signed)
Subjective:    Patient ID: Rhonda Baker, female    DOB: 10-May-1986, 34 y.o.   MRN: [id number]  HPI Pt is referred by Milus Height, PA,, to exclude endocrine cause of weight gain.  Pt reports 60 lbs x 8 months.  She was stung by a wasp in 2020, which was complicated by cellulitis of the legs.  She has not recently taken any steroids.  She has no h/o cancer, pituitary disorder, cataracts, PUD, HTN, osteoporosis, DM, bony fracture, or adrenal disorder.  She says sunlight precipitates skin rash.    Past Medical History:  Diagnosis Date  . Anxiety    Phreesia 11/25/2019  . Depression    Phreesia 11/25/2019    Past Surgical History:  Procedure Laterality Date  . CESAREAN SECTION     2011  . CESAREAN SECTION N/A    Phreesia 11/25/2019    Social History   Socioeconomic History  . Marital status: Divorced    Spouse name: Not on file  . Number of children: 1  . Years of education: 4  . Highest education level: Not on file  Occupational History    Comment: [employer]   Tobacco Use  . Smoking status: Never Smoker  . Smokeless tobacco: Never Used  Substance and Sexual Activity  . Alcohol use: Yes  . Drug use: Never  . Sexual activity: Not Currently  Other Topics Concern  . Not on file  Social History Narrative   Lives with son [name]-Primary Custody   [name]      Enjoy: hanging out with son, friends, going       Diet: eats all food groups    Caffeine: 1 cup of coffee a day   Water: 6-8 cups daily       Wears seat belt    Does not use phone while driving    Psychologist, sport and exercise at home   [employer] in [employer]    Social Determinants of Health   Financial Resource Strain: Low Risk   . Difficulty of Paying Living Expenses: Not hard at all  Food Insecurity: No Food Insecurity  . Worried About Programme researcher, broadcasting/film/video in the Last Year: Never true  . Ran Out of Food in the Last Year: Never true  Transportation Needs: No Transportation Needs  . Lack  of Transportation (Medical): No  . Lack of Transportation (Non-Medical): No  Physical Activity: Insufficiently Active  . Days of Exercise per Week: 1 day  . Minutes of Exercise per Session: 10 min  Stress: No Stress Concern Present  . Feeling of Stress : Only a little  Social Connections: Moderately Isolated  . Frequency of Communication with Friends and Family: More than three times a week  . Frequency of Social Gatherings with Friends and Family: More than three times a week  . Attends Religious Services: More than 4 times per year  . Active Member of Clubs or Organizations: No  . Attends Banker Meetings: Never  . Marital Status: Divorced  Catering manager Violence: Not At Risk  . Fear of Current or Ex-Partner: No  . Emotionally Abused: No  . Physically Abused: No  . Sexually Abused: No    Current Outpatient Medications on File Prior to Visit  Medication Sig Dispense Refill  . amphetamine-dextroamphetamine (ADDERALL) 30 MG tablet Take 30 mg by mouth 2 (two) times daily.    Marland Kitchen CHARLOTTE 24 FE 1-20 MG-MCG(24) CHEW Chew 1 tablet by mouth daily.    Marland Kitchen  citalopram (CELEXA) 20 MG tablet Take 20 mg by mouth at bedtime.    . clonazePAM (KLONOPIN) 1 MG tablet Take 2 mg by mouth at bedtime.    Marland Kitchen doxepin (SINEQUAN) 75 MG capsule Take 75 mg by mouth at bedtime.    . eszopiclone (LUNESTA) 1 MG TABS tablet Take 1 tablet (1 mg total) by mouth at bedtime as needed for sleep. Take immediately before bedtime 30 tablet 0  . lamoTRIgine (LAMICTAL) 200 MG tablet Take 200 mg by mouth at bedtime.    Marland Kitchen LINZESS 145 MCG CAPS capsule Take 145 mcg by mouth daily.    . nitrofurantoin, macrocrystal-monohydrate, (MACROBID) 100 MG capsule Take 100 mg by mouth as directed.    . ziprasidone (GEODON) 60 MG capsule Take 60 mg by mouth at bedtime.     No current facility-administered medications on file prior to visit.    Allergies  Allergen Reactions  . Other Itching  . Percocet  [Oxycodone-Acetaminophen]     hives    No family history on file.  BP 112/68 (BP Location: Left Arm, Patient Position: Sitting, Cuff Size: Large)   Pulse (!) 105   Ht 5\' 4"  (1.626 m)   Wt 227 lb 4 oz (103.1 kg)   SpO2 100%   BMI 39.01 kg/m    Review of Systems denies blurry vision, headache, hirsutism, sob, hyperpigmentation, and rash on the abdomen.  She has depression, acral numbness, insomnia, muscle cramps, sweating, hair loss, easy bruising, myalgias, fatigue, nausea, and muscle weakness.     Objective:   Physical Exam VS: see vs page GEN: no distress HEAD: head: no deformity eyes: no periorbital swelling, no proptosis external nose and ears are normal NECK: supple, thyroid is not enlarged CHEST WALL: no deformity LUNGS: clear to auscultation CV: reg rate and rhythm, no murmur MUSCULOSKELETAL: muscle bulk and strength are grossly normal.  no obvious joint swelling.  gait is normal and steady EXTEMITIES: no deformity.  no ulcer on the feet.  feet are of normal color and temp.  Trace bilat leg edema.  PULSES: dorsalis pedis intact bilat.  no carotid bruit NEURO:  cn 2-12 grossly intact.   readily moves all 4's.  sensation is intact to touch on the feet.  SKIN:  Normal texture and temperature.  No rash or suspicious lesion is visible.  No striae.   NODES:  None palpable at the neck PSYCH: alert, well-oriented.  Does not appear anxious nor depressed.  MRI (4/21): no mention is made of the pituitary.  I have reviewed outside records, and summarized: Pt was noted to have the above sxs, and referred here.  She was ref Neurol for paresthesias     Assessment & Plan:  Weight gain, new to me.  We discussed tests to exclude Cushing's.   Patient Instructions  Please do a 24-HR urine collection prior to the below: You should do a "dexamethasone suppression test."  For this, you would take dexamethasone 1 mg at 10 pm (I have sent a prescription to your pharmacy), then come in  for a "cortisol" blood test the next morning before 9 am.  You do not need to be fasting for this test. I would be happy to see you back here as needed.

## 2019-12-11 NOTE — Patient Instructions (Signed)
Please do a 24-HR urine collection prior to the below: You should do a "dexamethasone suppression test."  For this, you would take dexamethasone 1 mg at 10 pm (I have sent a prescription to your pharmacy), then come in for a "cortisol" blood test the next morning before 9 am.  You do not need to be fasting for this test. I would be happy to see you back here as needed.

## 2019-12-14 ENCOUNTER — Other Ambulatory Visit (INDEPENDENT_AMBULATORY_CARE_PROVIDER_SITE_OTHER): Payer: No Typology Code available for payment source

## 2019-12-14 ENCOUNTER — Other Ambulatory Visit: Payer: Self-pay

## 2019-12-14 DIAGNOSIS — R635 Abnormal weight gain: Secondary | ICD-10-CM

## 2019-12-14 LAB — CORTISOL: Cortisol, Plasma: 1.6 ug/dL

## 2019-12-15 ENCOUNTER — Other Ambulatory Visit: Payer: Self-pay | Admitting: *Deleted

## 2019-12-15 DIAGNOSIS — I10 Essential (primary) hypertension: Secondary | ICD-10-CM

## 2019-12-15 NOTE — Telephone Encounter (Signed)
These have been ordered and pt has been notified

## 2019-12-15 NOTE — Telephone Encounter (Signed)
Lipids, Hepatic Panel, CMP with GFR, Celiac Panel (fasting) for lipids for [facility]. Let her know which ones and to be fasting.

## 2019-12-16 ENCOUNTER — Telehealth: Payer: No Typology Code available for payment source | Admitting: Family Medicine

## 2019-12-16 ENCOUNTER — Encounter: Payer: Self-pay | Admitting: Family Medicine

## 2019-12-16 ENCOUNTER — Other Ambulatory Visit: Payer: Self-pay

## 2019-12-16 VITALS — BP 112/68 | Ht 64.0 in | Wt 227.0 lb

## 2019-12-16 DIAGNOSIS — F5101 Primary insomnia: Secondary | ICD-10-CM

## 2019-12-16 DIAGNOSIS — F419 Anxiety disorder, unspecified: Secondary | ICD-10-CM | POA: Diagnosis not present

## 2019-12-16 MED ORDER — ESCITALOPRAM OXALATE 20 MG PO TABS: 20.0000 mg | ORAL_TABLET | Freq: Every day | ORAL | 1 refills | Status: DC

## 2019-12-16 MED ORDER — ESZOPICLONE 2 MG PO TABS: 2.0000 mg | ORAL_TABLET | Freq: Every evening | ORAL | 3 refills | Status: DC | PRN

## 2019-12-16 NOTE — Assessment & Plan Note (Signed)
Increase Lunesta to 2 mg.  She is completely off her doxepin as well as reduction in Lamictal.  This can induce some trouble with sleeping secondary to the medication changes.  However she would like to continue with the process of weaning off of these medications.

## 2019-12-16 NOTE — Assessment & Plan Note (Signed)
Switching her from Celexa to Lexapro.  And then will start the reduction of Geodon.  Possible need for use of BuSpar 2 or 3 times daily if anxiety does not come under control with the reduction of Geodon.

## 2019-12-16 NOTE — Patient Instructions (Signed)
I appreciate the opportunity to provide you with care for your health and wellness. Today we discussed: ongoing medication changes  Follow up: as scheduled  No labs or referrals today  I hope the LEEP procedure goes well.  Please be patient with yourself and this process.  I know you are ready to feel better, I am hoping these last set of labs will help [place]. Please try to workout walking 10-15 mins twice daily is highly beneficial.  Eat a well balanced diet, fresh fruits and veggies. HYDRATE water-around half your body weight in ounces is a good level.  Off doxepin, reducing lamictal this week 50 mg every other day. Can do a 25 mg over 1-2 weeks, if needed. Switched from Celexa to Lexapro  Refill of Lunesta  Please continue to practice social distancing to keep you, your family, and our community safe.  If you must go out, please wear a mask and practice good handwashing.  It was a pleasure to see you and I look forward to continuing to work together on your health and well-being. Please do not hesitate to call the office if you need care or have questions about your care.  Have a wonderful day and week. With Gratitude, Tereasa Coop, DNP, AGNP-BC

## 2019-12-16 NOTE — Progress Notes (Signed)
Virtual Visit via Telephone Note   This visit type was conducted due to national recommendations for restrictions regarding the COVID-19 Pandemic (e.g. social distancing) in an effort to limit this patient's exposure and mitigate transmission in our community.  Due to her co-morbid illnesses, this patient is at least at moderate risk for complications without adequate follow up.  This format is felt to be most appropriate for this patient at this time.  The patient did not have access to video technology/had technical difficulties with video requiring transitioning to audio format only (telephone).  All issues noted in this document were discussed and addressed.  No physical exam could be performed with this format.   Evaluation Performed:  Follow-up visit  Date:  12/16/2019   ID:  AMILEY SHISHIDO, DOB [birth year], MRN [id number]  Patient Location: Home Provider Location: Office/Clinic  Location of Patient: Home Location of Provider: Telehealth Consent was obtain for visit to be over via telehealth. I verified that I am speaking with the correct person using two identifiers.  PCP:  Freddy Finner, NP   Chief Complaint: Medication changes/Anxiety   History of Present Illness:    Rhonda Baker is a 34 y.o. female with history of anxiety, depression, IBS, ADHD, insomnia.  Reports today that she is able to sleep with Lunesta 2 mgs. she is completely off the doxepin at this time.  She has reduced her Lamictal to 50 mg.  She is willing to switch from Celexa to Lexapro.  She feels like she has the flu she is very achy she just recently had a cortisol test which came back normal.  She is willing to come off the Geodon.  Of note I think the reduction in the Lamictal, but doxepin stopping and switching her to a different sleep medication she overall is having some transition changes which are making it hard for her to sleep at this time.  I think she will continue to have a little bit of increased  anxiety with the Celexa switching to Lexapro.  And will be started to come off the Geodon.  She is willing to do these changes and is hoping to get off her medications and only be on the ones that she absolutely needs.  She will be getting her updated labs as we discussed regarding her liver function.  In addition to a celiac panel.  Pending these we will decide whether or not we need to do a 24-hour urine copper test, chest x-ray, ultrasound of liver.  The patient does not have symptoms concerning for COVID-19 infection (fever, chills, cough, or new shortness of breath).   Past Medical, Surgical, Social History, Allergies, and Medications have been Reviewed.  Past Medical History:  Diagnosis Date  . Anxiety    [facility] 11/25/2019  . Depression    [facility] 11/25/2019   Past Surgical History:  Procedure Laterality Date  . CESAREAN SECTION     2011  . CESAREAN SECTION N/A    [facility] 11/25/2019     Current Meds  Medication Sig  . amphetamine-dextroamphetamine (ADDERALL) 30 MG tablet Take 30 mg by mouth 2 (two) times daily.  Marland Kitchen CHARLOTTE 24 FE 1-20 MG-MCG(24) CHEW Chew 1 tablet by mouth daily.  . clonazePAM (KLONOPIN) 1 MG tablet Take 2 mg by mouth at bedtime.  . lamoTRIgine (LAMICTAL) 200 MG tablet Take 200 mg by mouth at bedtime.  Marland Kitchen LINZESS 145 MCG CAPS capsule Take 145 mcg by mouth daily.  . ziprasidone (GEODON)  60 MG capsule Take 60 mg by mouth at bedtime.  . [DISCONTINUED] citalopram (CELEXA) 20 MG tablet Take 20 mg by mouth at bedtime.  . [DISCONTINUED] dexamethasone (DECADRON) 1 MG tablet Take 1 tablet (1 mg total) by mouth See admin instructions. Take at 9-10 PM, the night before blood test  . [DISCONTINUED] doxepin (SINEQUAN) 75 MG capsule Take 75 mg by mouth at bedtime.  . [DISCONTINUED] eszopiclone (LUNESTA) 1 MG TABS tablet Take 1 tablet (1 mg total) by mouth at bedtime as needed for sleep. Take immediately before bedtime  . [DISCONTINUED] nitrofurantoin,  macrocrystal-monohydrate, (MACROBID) 100 MG capsule Take 100 mg by mouth as directed.  . eszopiclone (LUNESTA) 2 MG TABS tablet Take 1 tablet (2 mg total) by mouth at bedtime as needed for sleep. Take immediately before bedtime     Allergies:   Other and Percocet [oxycodone-acetaminophen]   ROS:   Please see the history of present illness.    All other systems reviewed and are negative.   Labs/Other Tests and Data Reviewed:    Recent Labs: 11/19/2019: TSH 1.87 11/27/2019: ALT 60; BUN 9; Creat 0.71; Hemoglobin 13.3; Platelets 438; Potassium 4.8; Sodium 137   Recent Lipid Panel No results found for: CHOL, TRIG, HDL, CHOLHDL, LDLCALC, LDLDIRECT  Wt Readings from Last 3 Encounters:  12/16/19 227 lb (103 kg)  12/11/19 227 lb 4 oz (103.1 kg)  11/27/19 226 lb 1.9 oz (102.6 kg)     Objective:    Vital Signs:  BP 112/68   Ht 5\' 4"  (1.626 m)   Wt 227 lb (103 kg)   BMI 38.96 kg/m    VITAL SIGNS:  reviewed GEN:  Alert and oriented RESPIRATORY:  No shortness of breath noted in conversation PSYCH:  Noted depressed affect and mood  Pleasant in communication  ASSESSMENT & PLAN:    1. Primary insomnia  - eszopiclone (LUNESTA) 2 MG TABS tablet; Take 1 tablet (2 mg total) by mouth at bedtime as needed for sleep. Take immediately before bedtime  Dispense: 30 tablet; Refill: 3  2. Anxiety  - escitalopram (LEXAPRO) 20 MG tablet; Take 1 tablet (20 mg total) by mouth daily.  Dispense: 30 tablet; Refill: 1   Time:   Today, I have spent 15 minutes with the patient with telehealth technology discussing the above problems.     Medication Adjustments/Labs and Tests Ordered: Current medicines are reviewed at length with the patient today.  Concerns regarding medicines are outlined above.   Tests Ordered: No orders of the defined types were placed in this encounter.   Medication Changes: Meds ordered this encounter  Medications  . eszopiclone (LUNESTA) 2 MG TABS tablet    Sig: Take 1  tablet (2 mg total) by mouth at bedtime as needed for sleep. Take immediately before bedtime    Dispense:  30 tablet    Refill:  3    Order Specific Question:   Supervising Provider    Answer:   SIMPSON, MARGARET E [[id number]]  . escitalopram (LEXAPRO) 20 MG tablet    Sig: Take 1 tablet (20 mg total) by mouth daily.    Dispense:  30 tablet    Refill:  1    Order Specific Question:   Supervising Provider    Answer:   [[id number]]    Disposition:  Follow up 12/25/2019  Signed, 12/27/2019, NP  12/16/2019 5:01 PM     12/18/2019 Primary Care [facility]

## 2019-12-18 LAB — CORTISOL, URINE, 24 HOUR
24 Hour urine volume (VMAHVA): 5400 mL
Cortisol (Ur), Free: 28.1 mcg/24 h (ref 4.0–50.0)
RESULTS RECEIVED: 1.29 g/(24.h) (ref 0.50–2.15)

## 2019-12-25 ENCOUNTER — Telehealth: Payer: No Typology Code available for payment source | Admitting: Family Medicine

## 2019-12-25 ENCOUNTER — Encounter: Payer: Self-pay | Admitting: Family Medicine

## 2019-12-25 LAB — LIPID PANEL
Chol/HDL Ratio: 4.6 ratio — ABNORMAL HIGH (ref 0.0–4.4)
Cholesterol, Total: 195 mg/dL (ref 100–199)
HDL: 42 mg/dL (ref >39)
LDL Chol Calc (NIH): 128 mg/dL — ABNORMAL HIGH (ref 0–99)
Triglycerides: 138 mg/dL (ref 0–149)
VLDL Cholesterol Cal: 25 mg/dL (ref 5–40)

## 2019-12-25 LAB — CMP14+EGFR
ALT: 20 IU/L (ref 0–32)
AST: 18 IU/L (ref 0–40)
Albumin/Globulin Ratio: 1.4 (ref 1.2–2.2)
Albumin: 4.2 g/dL (ref 3.8–4.8)
Alkaline Phosphatase: 63 IU/L (ref 48–121)
BUN/Creatinine Ratio: 10 (ref 9–23)
BUN: 8 mg/dL (ref 6–20)
Bilirubin Total: 0.3 mg/dL (ref 0.0–1.2)
CO2: 19 mmol/L — ABNORMAL LOW (ref 20–29)
Calcium: 9.4 mg/dL (ref 8.7–10.2)
Chloride: 100 mmol/L (ref 96–106)
Creatinine, Ser: 0.77 mg/dL (ref 0.57–1.00)
GFR calc Af Amer: 116 mL/min/{1.73_m2} (ref >59)
GFR calc non Af Amer: 100 mL/min/{1.73_m2} (ref >59)
Globulin, Total: 3.1 g/dL (ref 1.5–4.5)
Glucose: 100 mg/dL — ABNORMAL HIGH (ref 65–99)
Potassium: 4.8 mmol/L (ref 3.5–5.2)
Sodium: 133 mmol/L — ABNORMAL LOW (ref 134–144)
Total Protein: 7.3 g/dL (ref 6.0–8.5)

## 2019-12-26 LAB — CELIAC DISEASE PANEL
Endomysial IgA: NEGATIVE
IgA/Immunoglobulin A, Serum: 265 mg/dL (ref 87–352)
Transglutaminase IgA: <2 U/mL (ref 0–3)

## 2019-12-26 LAB — HEPATIC FUNCTION PANEL
ALT: 19 IU/L (ref 0–32)
AST: 22 IU/L (ref 0–40)
Albumin: 4.2 g/dL (ref 3.8–4.8)
Alkaline Phosphatase: 62 IU/L (ref 48–121)
Bilirubin Total: 0.3 mg/dL (ref 0.0–1.2)
Bilirubin, Direct: 0.08 mg/dL (ref 0.00–0.40)
Total Protein: 7.2 g/dL (ref 6.0–8.5)

## 2020-01-01 ENCOUNTER — Encounter: Payer: Self-pay | Admitting: Family Medicine

## 2020-01-01 ENCOUNTER — Telehealth: Payer: No Typology Code available for payment source | Admitting: Family Medicine

## 2020-01-01 ENCOUNTER — Other Ambulatory Visit: Payer: Self-pay

## 2020-01-01 VITALS — BP 112/68 | Ht 64.0 in | Wt 227.0 lb

## 2020-01-01 DIAGNOSIS — M2550 Pain in unspecified joint: Secondary | ICD-10-CM

## 2020-01-01 DIAGNOSIS — F419 Anxiety disorder, unspecified: Secondary | ICD-10-CM | POA: Diagnosis not present

## 2020-01-01 MED ORDER — ESCITALOPRAM OXALATE 20 MG PO TABS: 20.0000 mg | ORAL_TABLET | Freq: Every day | ORAL | 1 refills | Status: DC

## 2020-01-01 MED ORDER — BUSPIRONE HCL 5 MG PO TABS: 5.0000 mg | ORAL_TABLET | Freq: Two times a day (BID) | ORAL | 0 refills | Status: DC

## 2020-01-01 MED ORDER — ZIPRASIDONE HCL 40 MG PO CAPS: 40.0000 mg | ORAL_CAPSULE | Freq: Every day | ORAL | 0 refills | Status: DC

## 2020-01-01 NOTE — Progress Notes (Signed)
Virtual Visit via Telephone Note   This visit type was conducted due to national recommendations for restrictions regarding the COVID-19 Pandemic (e.g. social distancing) in an effort to limit this patient's exposure and mitigate transmission in our community.  Due to her co-morbid illnesses, this patient is at least at moderate risk for complications without adequate follow up.  This format is felt to be most appropriate for this patient at this time.  The patient did not have access to video technology/had technical difficulties with video requiring transitioning to audio format only (telephone).  All issues noted in this document were discussed and addressed.  No physical exam could be performed with this format.    Evaluation Performed:  Follow-up visit  Date:  01/01/2020   ID:  Rhonda Baker, DOB [birth year], MRN [id number]  Patient Location: Home Provider Location: Office/Clinic  Location of Patient: Home Location of Provider: Telehealth Consent was obtain for visit to be over via telehealth. I verified that I am speaking with the correct person using two identifiers.  PCP:  Freddy Finner, NP   Chief Complaint: Medication adjustment, anxiety  History of Present Illness:    Rhonda Baker is a 34 y.o. female with with history of anxiety, depression, weight gain, insomnia among others.  Presents today for 2-week follow-up to see how she is doing with medication transitions.  She is now fully off Lamictal and doxepin.  She is ready for Geodon reduction.  We will start Geodon 40 mg now.  Additionally she continues to have trouble falling asleep but is sleeping well after falling asleep.  Anxiety is still a component.  Is seeing a therapist every Monday.  Is willing to add BuSpar on for short-term use in addition to boost Lexapro benefits of antianxiety.  She reports she does have just overall body aches at times.  Sometimes she feels like she has the flu.  She has not yet started working  out outside of work.  She is willing to start doing some yoga.  She reports that overall she is feeling well she has more clear minded after stopping medications.  She will be having a EMG study done at [facility].  No current treatment measures outside of the above will be implemented for extra orders until this is identified.  The patient does not have symptoms concerning for COVID-19 infection (fever, chills, cough, or new shortness of breath).   Past Medical, Surgical, Social History, Allergies, and Medications have been Reviewed.  Past Medical History:  Diagnosis Date  . Anxiety    [facility] 11/25/2019  . Depression    [facility] 11/25/2019   Past Surgical History:  Procedure Laterality Date  . CESAREAN SECTION     2011  . CESAREAN SECTION N/A    [facility] 11/25/2019     Current Meds  Medication Sig  . amphetamine-dextroamphetamine (ADDERALL) 30 MG tablet Take 30 mg by mouth 2 (two) times daily.  Marland Kitchen CHARLOTTE 24 FE 1-20 MG-MCG(24) CHEW Chew 1 tablet by mouth daily.  . clonazePAM (KLONOPIN) 1 MG tablet Take 2 mg by mouth at bedtime.  Marland Kitchen escitalopram (LEXAPRO) 20 MG tablet Take 1 tablet (20 mg total) by mouth daily.  . eszopiclone (LUNESTA) 2 MG TABS tablet Take 1 tablet (2 mg total) by mouth at bedtime as needed for sleep. Take immediately before bedtime  . LINZESS 145 MCG CAPS capsule Take 145 mcg by mouth daily.  . ziprasidone (GEODON) 60 MG capsule Take 60 mg by mouth  at bedtime.     Allergies:   Other and Percocet [oxycodone-acetaminophen]   ROS:   Please see the history of present illness.    All other systems reviewed and are negative.   Labs/Other Tests and Data Reviewed:    Recent Labs: 11/19/2019: TSH 1.87 11/27/2019: Hemoglobin 13.3; Platelets 438 12/24/2019: ALT 20; BUN 8; Creatinine, Ser 0.77; Potassium 4.8; Sodium 133   Recent Lipid Panel Lab Results  Component Value Date/Time   CHOL 195 12/24/2019 10:52 AM   TRIG 138 12/24/2019 10:52 AM   HDL 42 12/24/2019  10:52 AM   CHOLHDL 4.6 (H) 12/24/2019 10:52 AM   LDLCALC 128 (H) 12/24/2019 10:52 AM    Wt Readings from Last 3 Encounters:  01/01/20 227 lb (103 kg)  12/16/19 227 lb (103 kg)  12/11/19 227 lb 4 oz (103.1 kg)     Objective:    Vital Signs:  BP 112/68   Ht 5\' 4"  (1.626 m)   Wt 227 lb (103 kg)   BMI 38.96 kg/m    VITAL SIGNS:  reviewed GEN:  alert and oriented RESPIRATORY:  no shortness of breath in conversation PSYCH:  normal affect and mood   ASSESSMENT & PLAN:    1. Arthralgia, unspecified joint  Who that 2. Anxiety  - ziprasidone (GEODON) 40 MG capsule; Take 1 capsule (40 mg total) by mouth at bedtime.  Dispense: 30 capsule; Refill: 0 - escitalopram (LEXAPRO) 20 MG tablet; Take 1 tablet (20 mg total) by mouth daily.  Dispense: 30 tablet; Refill: 1 - busPIRone (BUSPAR) 5 MG tablet; Take 1 tablet (5 mg total) by mouth 2 (two) times daily.  Dispense: 60 tablet; Refill: 0   Time:   Today, I have spent 15 minutes with the patient with telehealth technology discussing the above problems.     Medication Adjustments/Labs and Tests Ordered: Current medicines are reviewed at length with the patient today.  Concerns regarding medicines are outlined above.   Tests Ordered: No orders of the defined types were placed in this encounter.   Medication Changes: No orders of the defined types were placed in this encounter.   Disposition:  Follow up 8 weeks  Signed, , NP  01/01/2020 11:19 AM     03/02/2020 Primary Care [facility]

## 2020-01-01 NOTE — Assessment & Plan Note (Addendum)
She has been worked up pretty thoroughly from an outpatient PCP standpoint.  Possible need for rheumatology referral.  She continues to have these aches and pains.  EMG coming up.  Pending those we will decide what might be best next steps.  I have strongly encouraged her to do yoga and to start doing exercise outside of work to help with joint health and weight reduction.

## 2020-01-01 NOTE — Patient Instructions (Addendum)
I appreciate the opportunity to provide you with care for your health and wellness. Today we discussed: Medication transitions, anxiety  Follow up: 4 weeks by phone  No labs or referrals today  So great that you are starting to feel better you are having more clear mindedness.  Strongly recommend that she start doing yoga regularly to do this at night I can help you rest better.  Reduce anxiety, and promote an overall sense of wellbeing.  To help with anxiety and get you to sleep better possibly short-term use of BuSpar twice daily.  1 dose to be taken at bedtime.  Another dose should be taken at least 8 hours prior.  Definitely continue therapy every Monday evening this is great.  Consider an adjustment from a stimulant medication like Adderall to a nonstimulant but still helpful medication for focus.  Please continue to practice social distancing to keep you, your family, and our community safe.  If you must go out, please wear a mask and practice good handwashing.  It was a pleasure to see you and I look forward to continuing to work together on your health and well-being. Please do not hesitate to call the office if you need care or have questions about your care.  Have a wonderful day and week. With Gratitude, Tereasa Coop, DNP, AGNP-BC

## 2020-01-01 NOTE — Assessment & Plan Note (Signed)
She is now on Lexapro and doing very well with this.  She is ready for the Geodon reduction.  We will start her on 40 mg a day.  Due to anxiety still not being as well controlled and reduction of Geodon will do BuSpar twice daily and follow-up in 4 weeks to see how she is doing.  Denies having any SI or HI.  Is seeing a therapist every Monday as well.  Of note she is on Adderall 30 mg twice daily I suggested that possible nonstimulant medication might be of benefit.  She will speak to her NP psychiatry office about this at her next visit.

## 2020-01-12 ENCOUNTER — Encounter: Payer: Self-pay | Admitting: Family Medicine

## 2020-01-13 ENCOUNTER — Other Ambulatory Visit: Payer: Self-pay

## 2020-01-13 DIAGNOSIS — R5383 Other fatigue: Secondary | ICD-10-CM

## 2020-01-13 DIAGNOSIS — M2550 Pain in unspecified joint: Secondary | ICD-10-CM

## 2020-01-13 NOTE — Telephone Encounter (Signed)
Please place referral for rheumatologist- Sherron Ales, PA in [facility]

## 2020-01-15 ENCOUNTER — Other Ambulatory Visit: Payer: Self-pay

## 2020-01-15 DIAGNOSIS — F419 Anxiety disorder, unspecified: Secondary | ICD-10-CM

## 2020-01-15 MED ORDER — ESCITALOPRAM OXALATE 20 MG PO TABS: 20.0000 mg | ORAL_TABLET | Freq: Every day | ORAL | 0 refills | Status: AC

## 2020-01-18 ENCOUNTER — Encounter: Payer: Self-pay | Admitting: Family Medicine

## 2020-01-19 ENCOUNTER — Other Ambulatory Visit: Payer: Self-pay | Admitting: Family Medicine

## 2020-01-19 DIAGNOSIS — M7989 Other specified soft tissue disorders: Secondary | ICD-10-CM

## 2020-01-19 MED ORDER — HYDROCHLOROTHIAZIDE 12.5 MG PO TABS: 12.5000 mg | ORAL_TABLET | Freq: Every day | ORAL | 1 refills | Status: DC

## 2020-01-20 ENCOUNTER — Encounter: Payer: Self-pay | Admitting: Family Medicine

## 2020-01-21 ENCOUNTER — Encounter: Payer: Self-pay | Admitting: Family Medicine

## 2020-01-26 ENCOUNTER — Other Ambulatory Visit: Payer: Self-pay

## 2020-01-26 DIAGNOSIS — F419 Anxiety disorder, unspecified: Secondary | ICD-10-CM

## 2020-01-26 MED ORDER — BUSPIRONE HCL 5 MG PO TABS: 5.0000 mg | ORAL_TABLET | Freq: Two times a day (BID) | ORAL | 1 refills | Status: DC

## 2020-01-26 MED ORDER — ZIPRASIDONE HCL 40 MG PO CAPS: 40.0000 mg | ORAL_CAPSULE | Freq: Every day | ORAL | 0 refills | Status: DC

## 2020-01-29 ENCOUNTER — Other Ambulatory Visit: Payer: Self-pay | Admitting: *Deleted

## 2020-01-29 DIAGNOSIS — F419 Anxiety disorder, unspecified: Secondary | ICD-10-CM

## 2020-01-29 MED ORDER — BUSPIRONE HCL 5 MG PO TABS: 5.0000 mg | ORAL_TABLET | Freq: Two times a day (BID) | ORAL | 1 refills | Status: DC

## 2020-02-03 ENCOUNTER — Encounter: Payer: Self-pay | Admitting: Family Medicine

## 2020-02-03 ENCOUNTER — Other Ambulatory Visit: Payer: Self-pay

## 2020-02-04 ENCOUNTER — Encounter: Payer: Self-pay | Admitting: Family Medicine

## 2020-02-04 ENCOUNTER — Other Ambulatory Visit: Payer: Self-pay | Admitting: *Deleted

## 2020-02-04 DIAGNOSIS — M7989 Other specified soft tissue disorders: Secondary | ICD-10-CM

## 2020-02-04 MED ORDER — HYDROCHLOROTHIAZIDE 12.5 MG PO TABS: 12.5000 mg | ORAL_TABLET | Freq: Every day | ORAL | 1 refills | Status: DC

## 2020-02-04 NOTE — Progress Notes (Signed)
This encounter was created in error - please disregard.

## 2020-02-16 ENCOUNTER — Encounter: Payer: Self-pay | Admitting: Family Medicine

## 2020-02-17 ENCOUNTER — Other Ambulatory Visit: Payer: Self-pay | Admitting: Family Medicine

## 2020-02-17 DIAGNOSIS — M7989 Other specified soft tissue disorders: Secondary | ICD-10-CM

## 2020-02-17 MED ORDER — HYDROCHLOROTHIAZIDE 12.5 MG PO TABS: 25.0000 mg | ORAL_TABLET | Freq: Two times a day (BID) | ORAL | 1 refills | Status: DC

## 2020-02-22 ENCOUNTER — Other Ambulatory Visit: Payer: Self-pay | Admitting: Family Medicine

## 2020-02-22 DIAGNOSIS — F419 Anxiety disorder, unspecified: Secondary | ICD-10-CM

## 2020-02-24 ENCOUNTER — Other Ambulatory Visit: Payer: Self-pay | Admitting: Family Medicine

## 2020-02-24 DIAGNOSIS — M7989 Other specified soft tissue disorders: Secondary | ICD-10-CM

## 2020-02-26 ENCOUNTER — Other Ambulatory Visit: Payer: Self-pay | Admitting: Family Medicine

## 2020-02-26 DIAGNOSIS — M7989 Other specified soft tissue disorders: Secondary | ICD-10-CM

## 2020-03-03 NOTE — Progress Notes (Signed)
Office Visit Note  Patient: Rhonda Baker             Date of Birth: 1986-02-20           MRN: [id number]             PCP: Freddy Finner, NP Referring: Freddy Finner, NP Visit Date: 03/04/2020 Occupation: Dental assistant  Subjective:  Numbness (bilateral feet)   History of Present Illness: Rhonda Baker is a 34 y.o. female here for evaluation of bilateral foot numbness and elevated CRP.  She is having pain in her lower extremities but most especially the feet and right great toe in particular since at least last year.  During this course of time she experienced treatment for cellulitis on the arm and then after a fall [place] with [place] knee injury was retreated for cellulitis.  Since that she describes having increased severity of lower extremity edema requiring substantial increase in diuretic medication.  She is also had very large weight gain estimates 60 pounds during this time.  She has arthralgias but primarily describes numbness and sharp pain especially in the heels and in the right great toe.  These were not particularly associated with swelling or redness.  She feels abnormal sensation in the affected areas.  Denies weakness or any falls with this.  She has tapered some of her psychiatric medications which she thinks is helpful for her fatigue but continues to have persistent symptoms.  Previous work-up in her primary care office and neurology included nerve conduction velocity test that was reportedly unremarkable.  Inflammatory work-up was negative for ANA, rheumatoid factor, CCP.  Normal sed rate but CRP elevated at 34.  Neurology work-up also included grossly normal serum protein electrophoresis, B12, B6 was high, normal copper, negative Lyme, normal TSH, normal 24-hour cortisol, normal PTH, negative celiac disease panel.  CRP 34 ANA negative SSA negative SSB negative RF negative    Activities of Daily Living:  Patient reports morning  stiffness for 24 hours.   Patient Reports nocturnal pain.  Difficulty dressing/grooming: Denies Difficulty climbing stairs: Denies Difficulty getting out of chair: Denies Difficulty using hands for taps, buttons, cutlery, and/or writing: Denies  Review of Systems  Constitutional: Positive for fatigue.  HENT: Positive for mouth dryness. Negative for mouth sores and nose dryness.   Eyes: Negative for pain, itching, visual disturbance and dryness.  Respiratory: Negative for cough, hemoptysis, shortness of breath and difficulty breathing.   Cardiovascular: Positive for swelling in legs/feet. Negative for chest pain and palpitations.       Fluid retention has improved with medication  Gastrointestinal: Negative for abdominal pain, blood in stool, constipation and diarrhea.  Endocrine: Negative for increased urination.  Genitourinary: Negative for difficulty urinating and painful urination.  Musculoskeletal: Positive for arthralgias, joint pain, joint swelling, myalgias, morning stiffness and myalgias. Negative for muscle weakness and muscle tenderness.  Skin: Positive for rash. Negative for color change and redness.       Patient complains of rash on face that comes and goes, has been using cream to treat  Allergic/Immunologic: Positive for susceptible to infections.  Neurological: Positive for numbness. Negative for dizziness, headaches, memory loss and weakness.  Hematological: Negative for swollen glands.  Psychiatric/Behavioral: Positive for sleep disturbance. Negative for depressed mood and confusion. The patient is not nervous/anxious.     PMFS History:  Patient Active Problem List   Diagnosis Date Noted  . Anxiety 12/16/2019  . Weight  gain 12/11/2019  . Numbness and tingling of both feet 11/27/2019  . Other fatigue 11/27/2019  . Vitamin D deficiency 11/27/2019  . Arthralgia 11/27/2019  . Essential hypertension 11/27/2019  . Primary insomnia 11/27/2019    Past Medical History:   Diagnosis Date  . Anxiety    Phreesia 11/25/2019  . Depression    [employer]reesia 11/25/2019    Family History  Problem Relation Age of Onset  . Glaucoma Mother   . Diabetes Father   . Hyperlipidemia Father   . Healthy Son   . Lupus Cousin   . Celiac disease Cousin    Past Surgical History:  Procedure Laterality Date  . CESAREAN SECTION     2011  . CESAREAN SECTION N/A    [employer]reesia 11/25/2019   Social History   Social History Narrative   Lives with son [name]-Primary Custody   [name]      Enjoy: hanging out with son, friends, going       Diet: eats all food groups    Caffeine: 1 cup of coffee a day   Water: 6-8 cups daily       Wears seat belt    Does not use phone while driving    Psychologist, sport and exercise at home   [employer] History  Administered Date(s) Administered  . PFIZER SARS-COV-2 Vaccination 06/03/2019, 06/24/2019     Objective: Vital Signs: BP 127/86 (BP Location: Right Arm, Patient Position: Sitting, Cuff Size: Small)   Pulse 92   Resp 14   Ht 5' 3.75" (1.619 m)   Wt 223 lb 12.8 oz (101.5 kg)   BMI 38.72 kg/m    Physical Exam HENT:     Right Ear: External ear normal.     Left Ear: External ear normal.     Mouth/Throat:     Mouth: Mucous membranes are moist.     Pharynx: Oropharynx is clear.  Eyes:     Conjunctiva/sclera: Conjunctivae normal.     Pupils: Pupils are equal, round, and reactive to light.  Cardiovascular:     Rate and Rhythm: Normal rate and regular rhythm.  Pulmonary:     Effort: Pulmonary effort is normal.     Breath sounds: Normal breath sounds.  Skin:    General: Skin is warm and dry.     Comments: Normal appearance of nailfold capillaries  Neurological:     General: No focal deficit present.     Mental Status: She is alert.     Comments: Normal knee jerk reflex and ankle jerk reflexes bilaterally Intact proprioception of great toe bilaterally  Strength is 5 out of 5 in neck  flexion/extension, shoulder abduction, elbow flexion/extension, grip strength, hip flexion, knee flexion/extension, ankle flexion/extension  Psychiatric:        Mood and Affect: Mood normal.      Musculoskeletal Exam: Neck full range of motion no tenderness Shoulder, elbow, wrist, fingers full range of motion no tenderness or swelling No paraspinal tenderness to palpation over upper and lower back Knees, ankles, MTPs full range of motion no tenderness or swelling  CDAI Exam: CDAI Score: -- Patient Global: --; Provider Global: -- Swollen: --; Tender: -- Joint Exam 03/04/2020   No joint exam has been documented for this visit   There is currently no information documented on the homunculus. Go to the Rheumatology activity and complete the homunculus joint exam.  Investigation: No additional findings.  Imaging: XR Foot 2 Views Left  Result Date: 03/04/2020 03/04/2020 x-ray foot 2 views left Normal tibiotalar joint alignment and space preserved.  Normal appearing calcaneus and midfoot.  Normal joint space preserved throughout MTP, PIP, DIP joints.  No erosions.  Normal bone mineralization.  No soft tissue swelling. Impression Normal appearance without osseus abnormality  XR Foot 2 Views Right  Result Date: 03/04/2020 03/04/2020 x-ray foot 2 views right Normal tibiotalar joint alignment and space preserved.  Normal appearing calcaneus and midfoot.  Normal joint space preserved throughout MTP, PIP, DIP joints.  No erosions.  Normal bone mineralization.  No soft tissue swelling. Impression Normal appearance without osseus abnormality   Recent Labs: Lab Results  Component Value Date   WBC 8.2 11/27/2019   HGB 13.3 11/27/2019   PLT 438 (H) 11/27/2019   NA 133 (L) 12/24/2019   K 4.8 12/24/2019   CL 100 12/24/2019   CO2 19 (L) 12/24/2019   GLUCOSE 100 (H) 12/24/2019   BUN 8 12/24/2019   CREATININE 0.77 12/24/2019   BILITOT 0.3 12/24/2019   ALKPHOS 63 12/24/2019   AST 18  12/24/2019   ALT 20 12/24/2019   PROT 7.3 12/24/2019   ALBUMIN 4.2 12/24/2019   CALCIUM 9.4 12/24/2019   GFRAA 116 12/24/2019    Speciality Comments: No specialty comments available.  Procedures:  No procedures performed Allergies: Other and Percocet [oxycodone-acetaminophen]   Assessment / Plan:     Visit Diagnoses: Numbness and tingling of both feet - Plan: XR Foot 2 Views Right, XR Foot 2 Views Left Arthralgia, unspecified joint  Her bilateral lower extremity pain and paresthesias are not associated with any apparent structural or inflammatory joint changes.  Her serology is negative except an elevated CRP.  The character of symptoms sounds neuropathic.  Inflammatory polyneuropathy would be very atypical to have sensory predominant symptoms rather than motor.  Also normal SPEP is suggestive against this.  Normal nerve conduction velocity test would also suggest against an impingement or entrapment syndrome.  Spinal MRI imaging findings reviewed and agree with previous assessment do not correlate well with the reported symptoms.  Checked x-ray of bilateral feet today to exclude bony abnormality which were unremarkable.  EMG study could be considered but in the absence any motor deficits this is also likely to be nondiagnostic. No evidence of systemic connective tissure disease or inflammatory arthritis as cause of symptoms at this time. She is understandably frustrated with ongoing issues and lack of diagnosis, but if symptoms change over time might clarify this.   Vitamin D deficiency  Mildly deficiency but already started on supplementation with PCP office.  Other fatigue Has chronic fatigue despite fair sleep quality.  She reports waking up about once per night to go to the restroom.  Had previous study recently with a low AHI.  She does not describe a pattern of generalized pain sensitization or widespread pain to really suggest fibromyalgia.  IV had normal thyroid function.  Also  does not describe pattern consistent with chronic exertional intolerance. Could be related to medication SSRI and benzodiazepine may be sedating but these are not new so unclear.  Orders: Orders Placed This Encounter  Procedures  . XR Foot 2 Views Right  . XR Foot 2 Views Left   No orders of the defined types were placed in this encounter.   Follow-Up Instructions: No follow-ups on file.   Fuller Plan, MD  Note - This record has been created using [employer].  Chart creation errors have been sought, but may not  always  have been located. Such creation errors do not reflect on  the standard of medical care.

## 2020-03-04 ENCOUNTER — Other Ambulatory Visit: Payer: Self-pay

## 2020-03-04 ENCOUNTER — Ambulatory Visit: Payer: Self-pay

## 2020-03-04 ENCOUNTER — Other Ambulatory Visit: Payer: Self-pay | Admitting: Family Medicine

## 2020-03-04 ENCOUNTER — Ambulatory Visit: Payer: BC Managed Care – PPO | Admitting: Internal Medicine

## 2020-03-04 ENCOUNTER — Encounter: Payer: Self-pay | Admitting: Internal Medicine

## 2020-03-04 VITALS — BP 127/86 | HR 92 | Resp 14 | Ht 63.75 in | Wt 223.8 lb

## 2020-03-04 DIAGNOSIS — R200 Anesthesia of skin: Secondary | ICD-10-CM | POA: Diagnosis not present

## 2020-03-04 DIAGNOSIS — R5383 Other fatigue: Secondary | ICD-10-CM | POA: Diagnosis not present

## 2020-03-04 DIAGNOSIS — R202 Paresthesia of skin: Secondary | ICD-10-CM

## 2020-03-04 DIAGNOSIS — E559 Vitamin D deficiency, unspecified: Secondary | ICD-10-CM

## 2020-03-04 DIAGNOSIS — M2550 Pain in unspecified joint: Secondary | ICD-10-CM

## 2020-03-04 NOTE — Patient Instructions (Signed)
Neuropathic Pain Neuropathic pain is pain caused by damage to the nerves that are responsible for certain sensations in your body (sensory nerves). The pain can be caused by:  Damage to the sensory nerves that send signals to your spinal cord and brain (peripheral nervous system).  Damage to the sensory nerves in your brain or spinal cord (central nervous system). Neuropathic pain can make you more sensitive to pain. Even a minor sensation can feel very painful. This is usually a long-term condition that can be difficult to treat. The type of pain differs from person to person. It may:  Start suddenly (acute), or it may develop slowly and last for a long time (chronic).  Come and go as damaged nerves heal, or it may stay at the same level for years.  Cause emotional distress, loss of sleep, and a lower quality of life. What are the causes? The most common cause of this condition is diabetes. Many other diseases and conditions can also cause neuropathic pain. Causes of neuropathic pain can be classified as:  Toxic. This is caused by medicines and chemicals. The most common cause of toxic neuropathic pain is damage from cancer treatments (chemotherapy).  Metabolic. This can be caused by: ? Diabetes. This is the most common disease that damages the nerves. ? Lack of vitamin B from long-term alcohol abuse.  Traumatic. Any injury that cuts, crushes, or stretches a nerve can cause damage and pain. A common example is feeling pain after losing an arm or leg (phantom limb pain).  Compression-related. If a sensory nerve gets trapped or compressed for a long period of time, the blood supply to the nerve can be cut off.  Vascular. Many blood vessel diseases can cause neuropathic pain by decreasing blood supply and oxygen to nerves.  Autoimmune. This type of pain results from diseases in which the body's defense system (immune system) mistakenly attacks sensory nerves. Examples of autoimmune diseases  that can cause neuropathic pain include lupus and multiple sclerosis.  Infectious. Many types of viral infections can damage sensory nerves and cause pain. Shingles infection is a common cause of this type of pain.  Inherited. Neuropathic pain can be a symptom of many diseases that are passed down through families (genetic). What increases the risk? You are more likely to develop this condition if:  You have diabetes.  You smoke.  You drink too much alcohol.  You are taking certain medicines, including medicines that kill cancer cells (chemotherapy) or that treat immune system disorders. What are the signs or symptoms? The main symptom is pain. Neuropathic pain is often described as:  Burning.  Shock-like.  Stinging.  Hot or cold.  Itching. How is this diagnosed? No single test can diagnose neuropathic pain. It is diagnosed based on:  Physical exam and your symptoms. Your health care provider will ask you about your pain. You may be asked to use a pain scale to describe how bad your pain is.  Tests. These may be done to see if you have a high sensitivity to pain and to help find the cause and location of any sensory nerve damage. They include: ? Nerve conduction studies to test how well nerve signals travel through your sensory nerves (electrodiagnostic testing). ? Stimulating your sensory nerves through electrodes on your skin and measuring the response in your spinal cord and brain (somatosensory evoked potential).  Imaging studies, such as: ? X-rays. ? CT scan. ? MRI. How is this treated? Treatment for neuropathic pain may change   over time. You may need to try different treatment options or a combination of treatments. Some options include:  Treating the underlying cause of the neuropathy, such as diabetes, kidney disease, or vitamin deficiencies.  Stopping medicines that can cause neuropathy, such as chemotherapy.  Medicine to relieve pain. Medicines may  include: ? Prescription or over-the-counter pain medicine. ? Anti-seizure medicine. ? Antidepressant medicines. ? Pain-relieving patches that are applied to painful areas of skin. ? A medicine to numb the area (local anesthetic), which can be injected as a nerve block.  Transcutaneous nerve stimulation. This uses electrical currents to block painful nerve signals. The treatment is painless.  Alternative treatments, such as: ? Acupuncture. ? Meditation. ? Massage. ? Physical therapy. ? Pain management programs. ? Counseling. Follow these instructions at home: Medicines   Take over-the-counter and prescription medicines only as told by your health care provider.  Do not drive or use heavy machinery while taking prescription pain medicine.  If you are taking prescription pain medicine, take actions to prevent or treat constipation. Your health care provider may recommend that you: ? Drink enough fluid to keep your urine pale yellow. ? Eat foods that are high in fiber, such as fresh fruits and vegetables, whole grains, and beans. ? Limit foods that are high in fat and processed sugars, such as fried or sweet foods. ? Take an over-the-counter or prescription medicine for constipation. Lifestyle   Have a good support system at home.  Consider joining a chronic pain support group.  Do not use any products that contain nicotine or tobacco, such as cigarettes and e-cigarettes. If you need help quitting, ask your health care provider.  Do not drink alcohol. General instructions  Learn as much as you can about your condition.  Work closely with all your health care providers to find the treatment plan that works best for you.  Ask your health care provider what activities are safe for you.  Keep all follow-up visits as told by your health care provider. This is important. Contact a health care provider if:  Your pain treatments are not working.  You are having side effects  from your medicines.  You are struggling with tiredness (fatigue), mood changes, depression, or anxiety. Summary  Neuropathic pain is pain caused by damage to the nerves that are responsible for certain sensations in your body (sensory nerves).  Neuropathic pain may come and go as damaged nerves heal, or it may stay at the same level for years.  Neuropathic pain is usually a long-term condition that can be difficult to treat. Consider joining a chronic pain support group. This information is not intended to replace advice given to you by your health care provider. Make sure you discuss any questions you have with your health care provider. Document Revised: 09/04/2018 Document Reviewed: 05/31/2017 Elsevier Patient Education  2020 [employer]  

## 2020-03-07 ENCOUNTER — Encounter: Payer: Self-pay | Admitting: Family Medicine

## 2020-03-08 ENCOUNTER — Other Ambulatory Visit: Payer: Self-pay | Admitting: Family Medicine

## 2020-03-11 ENCOUNTER — Telehealth: Payer: BC Managed Care – PPO | Admitting: Family Medicine

## 2020-03-15 ENCOUNTER — Encounter: Payer: Self-pay | Admitting: Family Medicine

## 2020-03-15 ENCOUNTER — Telehealth (INDEPENDENT_AMBULATORY_CARE_PROVIDER_SITE_OTHER): Payer: BC Managed Care – PPO | Admitting: Family Medicine

## 2020-03-15 ENCOUNTER — Other Ambulatory Visit: Payer: Self-pay

## 2020-03-15 VITALS — BP 125/79 | Ht 64.0 in | Wt 218.0 lb

## 2020-03-15 DIAGNOSIS — R200 Anesthesia of skin: Secondary | ICD-10-CM

## 2020-03-15 DIAGNOSIS — R202 Paresthesia of skin: Secondary | ICD-10-CM

## 2020-03-15 DIAGNOSIS — M2550 Pain in unspecified joint: Secondary | ICD-10-CM

## 2020-03-15 NOTE — Assessment & Plan Note (Signed)
She is worked up for rheumatology they are unsure where to send her at this time.  I will do some repeat labs this is been several months.  If anything is still elevated we will consider Plaquenil to see if this is autoimmune in nature.  If her labs are normal we will continue to help promote her with symptom management and overall wellness with exercise and weight reduction.

## 2020-03-15 NOTE — Patient Instructions (Signed)
  HAPPY FALL!  I appreciate the opportunity to provide you with care for your health and wellness. Today we discussed: on going concerns  Follow up: after labs   Labs- ordered today for [facility] No referrals today  Plan:  If labs continue to show some change we can try a medication to see if it might help.  If labs are completely normal we will continue symptom management.  Please continue to practice social distancing to keep you, your family, and our community safe.  If you must go out, please wear a mask and practice good handwashing.  It was a pleasure to see you and I look forward to continuing to work together on your health and well-being. Please do not hesitate to call the office if you need care or have questions about your care.  Have a wonderful day and week. With Gratitude, Tereasa Coop, DNP, AGNP-BC

## 2020-03-15 NOTE — Progress Notes (Signed)
Virtual Visit via Telephone Note   This visit type was conducted due to national recommendations for restrictions regarding the COVID-19 Pandemic (e.g. social distancing) in an effort to limit this patient's exposure and mitigate transmission in our community.  Due to her co-morbid illnesses, this patient is at least at moderate risk for complications without adequate follow up.  This format is felt to be most appropriate for this patient at this time.  The patient did not have access to video technology/had technical difficulties with video requiring transitioning to audio format only (telephone).  All issues noted in this document were discussed and addressed.  No physical exam could be performed with this format.    Evaluation Performed:  Follow-up visit  Date:  03/15/2020   ID:  Rhonda Baker, DOB 1985/08/08, MRN [id number]  Patient Location: Home Provider Location: Office/Clinic  Location of Patient: Home Location of Provider: Telehealth Consent was obtain for visit to be over via telehealth. I verified that I am speaking with the correct person using two identifiers.  PCP:  Freddy Finner, NP   Chief Complaint:  On going chronic conditions  History of Present Illness:    Rhonda Baker is a 34 y.o. female presents today for follow up on on going chronic issues. She most recently was seen by Rheumatology. I referred her there due to bilateral foot numbness and elevated CRP. Today she reports she is having pain in her lower extremities, especially the feet and right great toe in particular since at least last year. Xrays at Rheum were negative for findings.  Last year she was treated twice for cellulitis. Since that she describes having increased severity of lower extremity edema requiring an increase in diuretic medication.  She is also had very large weight gain estimates 60 pounds during this time- but reports today she is improved with her weight loss and is much more active than  she was. She has loss 10 pounds since August. We also have been working on taking her off several depression meds, of which she is doing well off of.  Previous work-up by me and neurology included nerve conduction velocity test that was reportedly unremarkable.  Inflammatory work-up was negative for ANA, rheumatoid factor, CCP.  Normal sed rate but CRP elevated at 34.  Neurology work-up also included grossly normal serum protein electrophoresis, B12, B6 was high, normal copper, negative Lyme, normal TSH, normal 24-hour cortisol, normal PTH, negative celiac disease panel.  She reports frustrations and wanting to feel and be better. She is working hard to lose weight and take care of herself more so that she is healthier. But with the on going symptoms that can not be explain she is worried.   The patient does not have symptoms concerning for COVID-19 infection (fever, chills, cough, or new shortness of breath).   Past Medical, Surgical, Social History, Allergies, and Medications have been Reviewed.  Past Medical History:  Diagnosis Date  . Anxiety    [facility] 11/25/2019  . Depression    [facility] 11/25/2019   Past Surgical History:  Procedure Laterality Date  . CESAREAN SECTION     2011  . CESAREAN SECTION N/A    [facility] 11/25/2019     Current Meds  Medication Sig  . amphetamine-dextroamphetamine (ADDERALL) 30 MG tablet Take 30 mg by mouth 2 (two) times daily.  . busPIRone (BUSPAR) 5 MG tablet Take 1 tablet (5 mg total) by mouth 2 (two) times daily.  . clonazePAM (KLONOPIN)  1 MG tablet Take 2 mg by mouth at bedtime.  Marland Kitchen escitalopram (LEXAPRO) 20 MG tablet Take 1 tablet (20 mg total) by mouth daily.  . eszopiclone (LUNESTA) 2 MG TABS tablet Take 1 tablet (2 mg total) by mouth at bedtime as needed for sleep. Take immediately before bedtime  . hydrochlorothiazide (HYDRODIURIL) 50 MG tablet Take 1 tablet (50 mg total) by mouth 2 (two) times daily.  . hydrocortisone 2.5 % cream Apply 1  application topically as needed.  Colleen Can FE 1/20 1-20 MG-MCG tablet Take 1 tablet by mouth daily.  Marland Kitchen LINZESS 145 MCG CAPS capsule Take 145 mcg by mouth as needed.      Allergies:   Other and Percocet [oxycodone-acetaminophen]   ROS:   Please see the history of present illness.    All other systems reviewed and are negative.   Labs/Other Tests and Data Reviewed:    Recent Labs: 11/19/2019: TSH 1.87 11/27/2019: Hemoglobin 13.3; Platelets 438 12/24/2019: ALT 20; BUN 8; Creatinine, Ser 0.77; Potassium 4.8; Sodium 133   Recent Lipid Panel Lab Results  Component Value Date/Time   CHOL 195 12/24/2019 10:52 AM   TRIG 138 12/24/2019 10:52 AM   HDL 42 12/24/2019 10:52 AM   CHOLHDL 4.6 (H) 12/24/2019 10:52 AM   LDLCALC 128 (H) 12/24/2019 10:52 AM    Wt Readings from Last 3 Encounters:  03/15/20 117 lb (53.1 kg)  03/04/20 223 lb 12.8 oz (101.5 kg)  01/01/20 227 lb (103 kg)     Objective:    Vital Signs:  BP 125/79   Ht 5\' 4"  (1.626 m)   Wt 117 lb (53.1 kg)   BMI 20.08 kg/m    VITAL SIGNS:  reviewed GEN:  no acute distress RESPIRATORY:  no shortness of breath in conversation  PSYCH:  normal affect and mood  ASSESSMENT & PLAN:    1. Numbness and tingling of both legs  - ANA - CBC - Comprehensive metabolic panel - C-reactive protein - Sedimentation rate  2. Arthralgia, unspecified joint  - ANA - CBC - Comprehensive metabolic panel - C-reactive protein - Sedimentation rate   Time:   Today, I have spent 7 minutes with the patient with telehealth technology discussing the above problems.     Medication Adjustments/Labs and Tests Ordered: Current medicines are reviewed at length with the patient today.  Concerns regarding medicines are outlined above.   Tests Ordered: No orders of the defined types were placed in this encounter.   Medication Changes: No orders of the defined types were placed in this encounter.    Note: This dictation was prepared with  Dragon dictation along with smaller phrase technology. Similar sounding words can be transcribed inadequately or may not be corrected upon review. Any transcriptional errors that result from this process are unintentional.      Disposition:  Follow up after labs Signed, , NP  03/15/2020 1:23 PM     03/17/2020 [facility]

## 2020-03-15 NOTE — Assessment & Plan Note (Signed)
Updated labs ordered. Willing to trial Plaquenil if labs are still elevated with inflammation.  There is been no change in her symptoms after weaning off some of her psychiatric medications.  She is aware that this can be an ongoing process.  Though she is very frustrated with the ongoing process.

## 2020-03-16 ENCOUNTER — Encounter: Payer: Self-pay | Admitting: Family Medicine

## 2020-03-18 DIAGNOSIS — M2550 Pain in unspecified joint: Secondary | ICD-10-CM | POA: Diagnosis not present

## 2020-03-18 DIAGNOSIS — R202 Paresthesia of skin: Secondary | ICD-10-CM | POA: Diagnosis not present

## 2020-03-18 DIAGNOSIS — R200 Anesthesia of skin: Secondary | ICD-10-CM | POA: Diagnosis not present

## 2020-03-21 LAB — CBC
Hematocrit: 39.2 % (ref 34.0–46.6)
Hemoglobin: 13.6 g/dL (ref 11.1–15.9)
MCH: 32 pg (ref 26.6–33.0)
MCHC: 34.7 g/dL (ref 31.5–35.7)
MCV: 92 fL (ref 79–97)
Platelets: 499 10*3/uL — ABNORMAL HIGH (ref 150–450)
RBC: 4.25 x10E6/uL (ref 3.77–5.28)
RDW: 12 % (ref 11.7–15.4)
WBC: 10.5 10*3/uL (ref 3.4–10.8)

## 2020-03-21 LAB — COMPREHENSIVE METABOLIC PANEL
ALT: 42 IU/L — ABNORMAL HIGH (ref 0–32)
AST: 32 IU/L (ref 0–40)
Albumin/Globulin Ratio: 1.6 (ref 1.2–2.2)
Albumin: 4.5 g/dL (ref 3.8–4.8)
Alkaline Phosphatase: 71 IU/L (ref 44–121)
BUN/Creatinine Ratio: 6 — ABNORMAL LOW (ref 9–23)
BUN: 6 mg/dL (ref 6–20)
Bilirubin Total: 0.8 mg/dL (ref 0.0–1.2)
CO2: 25 mmol/L (ref 20–29)
Calcium: 9.6 mg/dL (ref 8.7–10.2)
Chloride: 83 mmol/L — ABNORMAL LOW (ref 96–106)
Creatinine, Ser: 0.94 mg/dL (ref 0.57–1.00)
GFR calc Af Amer: 91 mL/min/{1.73_m2} (ref >59)
GFR calc non Af Amer: 79 mL/min/{1.73_m2} (ref >59)
Globulin, Total: 2.9 g/dL (ref 1.5–4.5)
Glucose: 110 mg/dL — ABNORMAL HIGH (ref 65–99)
Potassium: 3.1 mmol/L — ABNORMAL LOW (ref 3.5–5.2)
Sodium: 126 mmol/L — ABNORMAL LOW (ref 134–144)
Total Protein: 7.4 g/dL (ref 6.0–8.5)

## 2020-03-21 LAB — SEDIMENTATION RATE: Sed Rate: 23 mm/hr (ref 0–32)

## 2020-03-21 LAB — ANA: Anti Nuclear Antibody (ANA): NEGATIVE

## 2020-03-21 LAB — C-REACTIVE PROTEIN: CRP: 53 mg/L — ABNORMAL HIGH (ref 0–10)

## 2020-03-22 ENCOUNTER — Other Ambulatory Visit: Payer: Self-pay | Admitting: Family Medicine

## 2020-03-22 ENCOUNTER — Encounter: Payer: Self-pay | Admitting: Family Medicine

## 2020-03-22 DIAGNOSIS — M2550 Pain in unspecified joint: Secondary | ICD-10-CM

## 2020-03-22 DIAGNOSIS — R5383 Other fatigue: Secondary | ICD-10-CM

## 2020-03-22 DIAGNOSIS — M7989 Other specified soft tissue disorders: Secondary | ICD-10-CM

## 2020-03-22 DIAGNOSIS — R7982 Elevated C-reactive protein (CRP): Secondary | ICD-10-CM

## 2020-03-22 DIAGNOSIS — E876 Hypokalemia: Secondary | ICD-10-CM

## 2020-03-22 MED ORDER — PREDNISONE 10 MG (21) PO TBPK: ORAL_TABLET | ORAL | 0 refills | Status: DC

## 2020-03-22 MED ORDER — FUROSEMIDE 20 MG PO TABS: 20.0000 mg | ORAL_TABLET | Freq: Every day | ORAL | 3 refills | Status: DC

## 2020-03-22 MED ORDER — POTASSIUM CHLORIDE ER 10 MEQ PO TBCR: 10.0000 meq | EXTENDED_RELEASE_TABLET | Freq: Every day | ORAL | 3 refills | Status: DC

## 2020-03-23 ENCOUNTER — Encounter: Payer: Self-pay | Admitting: Family Medicine

## 2020-03-28 ENCOUNTER — Encounter: Payer: Self-pay | Admitting: Family Medicine

## 2020-03-29 NOTE — Telephone Encounter (Signed)
Do you know what she is needing to ask or speak to me about? Please let her know the schedules is full today and I will call once able, But might be at end of the day.

## 2020-03-30 ENCOUNTER — Emergency Department (HOSPITAL_BASED_OUTPATIENT_CLINIC_OR_DEPARTMENT_OTHER): Payer: Self-pay

## 2020-03-30 ENCOUNTER — Telehealth: Payer: Self-pay

## 2020-03-30 ENCOUNTER — Other Ambulatory Visit: Payer: Self-pay

## 2020-03-30 ENCOUNTER — Emergency Department (HOSPITAL_BASED_OUTPATIENT_CLINIC_OR_DEPARTMENT_OTHER)
Admission: EM | Admit: 2020-03-30 | Discharge: 2020-03-30 | Disposition: A | Discharge status: Home / Self Care | Payer: Self-pay | Attending: Emergency Medicine | Admitting: Emergency Medicine

## 2020-03-30 ENCOUNTER — Other Ambulatory Visit: Payer: Self-pay | Admitting: Family Medicine

## 2020-03-30 ENCOUNTER — Encounter (HOSPITAL_BASED_OUTPATIENT_CLINIC_OR_DEPARTMENT_OTHER): Payer: Self-pay

## 2020-03-30 DIAGNOSIS — R000 Tachycardia, unspecified: Secondary | ICD-10-CM | POA: Insufficient documentation

## 2020-03-30 DIAGNOSIS — R630 Anorexia: Secondary | ICD-10-CM | POA: Insufficient documentation

## 2020-03-30 DIAGNOSIS — I10 Essential (primary) hypertension: Secondary | ICD-10-CM | POA: Insufficient documentation

## 2020-03-30 DIAGNOSIS — E860 Dehydration: Secondary | ICD-10-CM | POA: Insufficient documentation

## 2020-03-30 DIAGNOSIS — R5383 Other fatigue: Secondary | ICD-10-CM | POA: Insufficient documentation

## 2020-03-30 DIAGNOSIS — R519 Headache, unspecified: Secondary | ICD-10-CM | POA: Insufficient documentation

## 2020-03-30 LAB — COMPREHENSIVE METABOLIC PANEL
ALT: 23 U/L (ref 0–44)
AST: 18 U/L (ref 15–41)
Albumin: 3.9 g/dL (ref 3.5–5.0)
Alkaline Phosphatase: 49 U/L (ref 38–126)
Anion gap: 12 (ref 5–15)
BUN: 8 mg/dL (ref 6–20)
CO2: 24 mmol/L (ref 22–32)
Calcium: 8.9 mg/dL (ref 8.9–10.3)
Chloride: 101 mmol/L (ref 98–111)
Creatinine, Ser: 0.77 mg/dL (ref 0.44–1.00)
GFR, Estimated: >60 mL/min (ref >60)
Glucose, Bld: 99 mg/dL (ref 70–99)
Potassium: 3.7 mmol/L (ref 3.5–5.1)
Sodium: 137 mmol/L (ref 135–145)
Total Bilirubin: 0.4 mg/dL (ref 0.3–1.2)
Total Protein: 7.7 g/dL (ref 6.5–8.1)

## 2020-03-30 LAB — CBC WITH DIFFERENTIAL/PLATELET
Abs Immature Granulocytes: 0.05 10*3/uL (ref 0.00–0.07)
Basophils Absolute: 0 10*3/uL (ref 0.0–0.1)
Basophils Relative: 0 %
Eosinophils Absolute: 0.1 10*3/uL (ref 0.0–0.5)
Eosinophils Relative: 0 %
HCT: 38.7 % (ref 36.0–46.0)
Hemoglobin: 13.1 g/dL (ref 12.0–15.0)
Immature Granulocytes: 0 %
Lymphocytes Relative: 31 %
Lymphs Abs: 3.5 10*3/uL (ref 0.7–4.0)
MCH: 32.6 pg (ref 26.0–34.0)
MCHC: 33.9 g/dL (ref 30.0–36.0)
MCV: 96.3 fL (ref 80.0–100.0)
Monocytes Absolute: 0.5 10*3/uL (ref 0.1–1.0)
Monocytes Relative: 4 %
Neutro Abs: 7.3 10*3/uL (ref 1.7–7.7)
Neutrophils Relative %: 65 %
Platelets: 453 10*3/uL — ABNORMAL HIGH (ref 150–400)
RBC: 4.02 MIL/uL (ref 3.87–5.11)
RDW: 13 % (ref 11.5–15.5)
WBC: 11.3 10*3/uL — ABNORMAL HIGH (ref 4.0–10.5)
nRBC: 0 % (ref 0.0–0.2)

## 2020-03-30 LAB — MAGNESIUM: Magnesium: 2.2 mg/dL (ref 1.7–2.4)

## 2020-03-30 LAB — PREGNANCY, URINE: Preg Test, Ur: NEGATIVE

## 2020-03-30 MED ORDER — METOCLOPRAMIDE HCL 5 MG/ML IJ SOLN
10.0000 mg | Freq: Once | INTRAMUSCULAR | Status: AC
  Administered 2020-03-30: 10 mg via INTRAVENOUS
  Filled 2020-03-30: qty 2

## 2020-03-30 MED ORDER — SODIUM CHLORIDE 0.9 % IV BOLUS
1000.0000 mL | Freq: Once | INTRAVENOUS | Status: AC
  Administered 2020-03-30: 1000 mL via INTRAVENOUS

## 2020-03-30 MED ORDER — KETOROLAC TROMETHAMINE 30 MG/ML IJ SOLN
30.0000 mg | Freq: Once | INTRAMUSCULAR | Status: AC
  Administered 2020-03-30: 30 mg via INTRAVENOUS
  Filled 2020-03-30: qty 1

## 2020-03-30 NOTE — ED Provider Notes (Signed)
[facility] EMERGENCY DEPARTMENT Provider Note   CSN: [id number] Arrival date & time: 03/30/20  1505     History Chief Complaint  Patient presents with  . Multiple c/o    Rhonda Baker is a 34 y.o. female with a past medical history of anxiety, depression presenting to the ED with multiple complaints. States for the past year she has been seeing her primary care provider and a rheumatologist for these ongoing complaints.  Reports numbness in bilateral legs and feeling "restless" as well as joint pains, headache, decreased appetite and fatigue.  States that 2 weeks ago she had lab work done by her PCP which showed "dangerously low sodium and potassium levels, but my doctor just told me to drink Gatorade to fix it."  She was prescribed HCTZ and was told to stop this due to her hypokalemia.  She reports right ankle pain for the past month and is concerned because "no one's been able to tell me what is wrong with it."  She denies any injuries or falls.  No fevers.  She has had nausea.  No abdominal pain, chest pain, back pain, vision changes. Due to ongoing symptoms, PCP advised her to come to the ER for quicker lab work. She denies possibility of pregnancy.   HPI     Past Medical History:  Diagnosis Date  . Anxiety    [facility] 11/25/2019  . Depression    [facility] 11/25/2019    Patient Active Problem List   Diagnosis Date Noted  . Anxiety 12/16/2019  . Weight gain 12/11/2019  . Numbness and tingling of both legs 11/27/2019  . Other fatigue 11/27/2019  . Vitamin D deficiency 11/27/2019  . Arthralgia 11/27/2019  . Essential hypertension 11/27/2019  . Primary insomnia 11/27/2019    Past Surgical History:  Procedure Laterality Date  . CESAREAN SECTION     2011  . CESAREAN SECTION N/A    [facility] 11/25/2019     OB History   No obstetric history on file.     Family History  Problem Relation Age of Onset  . Glaucoma Mother   . Diabetes Father   .  Hyperlipidemia Father   . Healthy Son   . Lupus Cousin   . Celiac disease Cousin     Social History   Tobacco Use  . Smoking status: Never Smoker  . Smokeless tobacco: Never Used  Vaping Use  . Vaping Use: Never used  Substance Use Topics  . Alcohol use: Not Currently  . Drug use: Never    Home Medications Prior to Admission medications   Medication Sig Start Date End Date Taking? Authorizing Provider  amphetamine-dextroamphetamine (ADDERALL) 30 MG tablet Take 30 mg by mouth 2 (two) times daily.   Yes [provider]  busPIRone (BUSPAR) 5 MG tablet Take 1 tablet (5 mg total) by mouth 2 (two) times daily. 01/29/20  Yes Freddy Finner, NP  clonazePAM (KLONOPIN) 1 MG tablet Take 1-2 mg by mouth at bedtime.    Yes [provider]  escitalopram (LEXAPRO) 20 MG tablet Take 1 tablet (20 mg total) by mouth daily. 01/15/20  Yes Freddy Finner, NP  eszopiclone (LUNESTA) 2 MG TABS tablet Take 1 tablet (2 mg total) by mouth at bedtime as needed for sleep. Take immediately before bedtime Patient taking differently: Take 2 mg by mouth at bedtime. Take immediately before bedtime 12/16/19  Yes Freddy Finner, NP  hydrocortisone 2.5 % cream Apply 1 application topically as needed (itching).  01/03/20  Yes [provider]  JUNEL FE 1/20 1-20 MG-MCG tablet Take 1 tablet by mouth daily. 02/10/20  Yes [provider]  LINZESS 145 MCG CAPS capsule Take 145 mcg by mouth as needed (IBS).  11/23/19  Yes [provider]  potassium chloride (KLOR-CON) 10 MEQ tablet Take 1 tablet (10 mEq total) by mouth daily. 03/22/20  Yes Freddy Finner, NP  furosemide (LASIX) 20 MG tablet Take 1 tablet (20 mg total) by mouth daily. 03/22/20   Freddy Finner, NP    Allergies    Percocet [oxycodone-acetaminophen]  Review of Systems   Review of Systems  Constitutional: Positive for activity change, appetite change and fatigue. Negative for chills and fever.  HENT: Negative for  ear pain, rhinorrhea, sneezing and sore throat.   Eyes: Negative for photophobia and visual disturbance.  Respiratory: Negative for cough, chest tightness, shortness of breath and wheezing.   Cardiovascular: Negative for chest pain and palpitations.  Gastrointestinal: Positive for nausea. Negative for abdominal pain, blood in stool, constipation, diarrhea and vomiting.  Genitourinary: Negative for dysuria, hematuria and urgency.  Musculoskeletal: Positive for arthralgias and myalgias.  Skin: Negative for rash.  Neurological: Positive for headaches. Negative for dizziness, weakness and light-headedness.    Physical Exam Updated Vital Signs BP 105/89   Pulse 84   Temp 97.7 F (36.5 C) (Oral)   Resp 20   Ht 5\' 4"  (1.626 m)   Wt 101.2 kg   SpO2 100%   BMI 38.28 kg/m   Physical Exam Vitals and nursing note reviewed.  Constitutional:      General: She is not in acute distress.    Appearance: She is well-developed.  HENT:     Head: Normocephalic and atraumatic.     Nose: Nose normal.  Eyes:     General: No scleral icterus.       Right eye: No discharge.        Left eye: No discharge.     Conjunctiva/sclera: Conjunctivae normal.     Pupils: Pupils are equal, round, and reactive to light.  Cardiovascular:     Rate and Rhythm: Regular rhythm. Tachycardia present.     Heart sounds: Normal heart sounds. No murmur heard.  No friction rub. No gallop.   Pulmonary:     Effort: Pulmonary effort is normal. No respiratory distress.     Breath sounds: Normal breath sounds.  Abdominal:     General: Bowel sounds are normal. There is no distension.     Palpations: Abdomen is soft.     Tenderness: There is no abdominal tenderness. There is no guarding.  Musculoskeletal:        General: Normal range of motion.     Cervical back: Normal range of motion and neck supple.     Comments: Tenderness to palpation of the right lateral ankle without changes to range of motion, edema, erythema,  warmth of joint or deformity.  2+ DP pulse noted bilaterally.  Skin:    General: Skin is warm and dry.     Findings: No rash.  Neurological:     General: No focal deficit present.     Mental Status: She is alert and oriented to person, place, and time.     Cranial Nerves: No cranial nerve deficit.     Sensory: No sensory deficit.     Motor: No weakness or abnormal muscle tone.     Coordination: Coordination normal.     Comments: Pupils reactive. No facial  asymmetry noted. Cranial nerves appear grossly intact. Sensation intact to light touch on face, BUE and BLE. Strength 5/5 in BUE and BLE.  Alert and oriented x4.     ED Results / Procedures / Treatments   Labs (all labs ordered are listed, but only abnormal results are displayed) Labs Reviewed  CBC WITH DIFFERENTIAL/PLATELET - Abnormal; Notable for the following components:      Result Value   WBC 11.3 (*)    Platelets 453 (*)    All other components within normal limits  COMPREHENSIVE METABOLIC PANEL  PREGNANCY, URINE  MAGNESIUM    EKG None  Radiology DG Ankle Complete Right  Result Date: 03/30/2020 CLINICAL DATA:  Sudden right ankle pain onset 2 weeks ago. Not improving. Lateral malleolar pain most faithful with weight-bearing. EXAM: RIGHT ANKLE - COMPLETE 3+ VIEW COMPARISON:  X-ray right foot 03/04/2020. FINDINGS: There is no evidence of fracture, dislocation, or joint effusion. There is no evidence of arthropathy or other focal bone abnormality. Mild sub cutaneous soft tissue edema of the right ankle. IMPRESSION: Mild subcutaneus soft tissue edema of the right ankle with no underlying acute displaced fracture or dislocation. Electronically Signed   By: Tish FredericksonMorgane  Naveau M.D.   On: 03/30/2020 16:44    Procedures Procedures (including critical care time)  Medications Ordered in ED Medications  sodium chloride 0.9 % bolus 1,000 mL (1,000 mLs Intravenous New Bag/Given 03/30/20 1636)  metoCLOPramide (REGLAN) injection 10 mg  (10 mg Intravenous Given 03/30/20 1632)  ketorolac (TORADOL) 30 MG/ML injection 30 mg (30 mg Intravenous Given 03/30/20 1716)    ED Course  I have reviewed the triage vital signs and the nursing notes.  Pertinent labs & imaging results that were available during my care of the patient were reviewed by me and considered in my medical decision making (see chart for details).  Clinical Course as of Mar 30 1737  Wed Mar 30, 2020  1634 Sodium: 137 [HK]  1634 Potassium: 3.7 [HK]    Clinical Course User Index [HK] Dietrich PatesKhatri, Vonda Harth, PA-C   MDM Rules/Calculators/A&P                          34 year old female with a past medical history of anxiety, depression presenting to the ED with multiple complaints.  Reports for the past year she has had numbness in bilateral legs, feeling restless, joint pains, headache, decreased appetite energy and fatigue.  She has had several visits with PCP and rheumatology and "no one's been able to tell me what is fine with me."  Denies injuries or falls.  No fevers.  Started having right ankle pain for the past month.  No injuries or trauma prior to this.  She has no neurological deficits on exam.  She has some tenderness of the right ankle without changes to range of motion.  Areas neurovascularly intact.  She denies chest pain, abdominal pain or back pain.  X-ray of the ankle is negative.  CBC with mild leukocytosis and elevation in platelets which could be reactive to her symptoms.  No anemia.  CMP with normal sodium and potassium level today as compared to 2 weeks ago.  Urine pregnancy is negative.  Magnesium level is normal.  Patient was given Reglan, IV fluids and Toradol here with improvement in her symptoms.  Unfortunately I do not know what has been causing her chronic symptoms but able to rule out electrolyte derangements or other emergent cause.  Doubt infectious or  vascular cause of her ankle pain and feel this could be more so due to this possible rheumatologic or  autoimmune disorder.  Tachycardia has improved here with IV fluids suspect 2/2 dehydration.  I feel she will benefit from continued follow-up with her PCP and rheumatologist.  I understand patient's frustration regarding the course of the year and her numerous visits and lab draws.  Patient is agreeable to the plan.  Return precautions given.   Patient is hemodynamically stable, in NAD, and able to ambulate in the ED. Evaluation does not show pathology that would require ongoing emergent intervention or inpatient treatment. I explained the diagnosis to the patient. Pain has been managed and has no complaints prior to discharge. Patient is comfortable with above plan and is stable for discharge at this time. All questions were answered prior to disposition. Strict return precautions for returning to the ED were discussed. Encouraged follow up with PCP.   An After Visit Summary was printed and given to the patient.   Portions of this note were generated with Scientist, clinical (histocompatibility and immunogenetics). Dictation errors may occur despite best attempts at proofreading.  Final Clinical Impression(s) / ED Diagnoses Final diagnoses:  Dehydration    Rx / DC Orders ED Discharge Orders    None       Dietrich Pates, PA-C 03/30/20 1738    Gwyneth Sprout, MD 03/30/20 2233

## 2020-03-30 NOTE — Telephone Encounter (Signed)
Personally called patient and advised her to go to the emergency room if she was feeling poorly.  As lab work to be checked much quicker there and a faster assessment since we are out of the office at this time given the office move.  She reported understanding of this and said that she would be closest to [facility] med center and would have there at this time.

## 2020-03-30 NOTE — Discharge Instructions (Addendum)
Continue taking your home medications. Follow-up with your primary care provider. Your lab work today showed a normal sodium and potassium level.  Your x-ray did not show any concerning abnormalities. Return to the ER if you start to experience chest pain, shortness of breath, numbness in arms or legs that is worsened, blurry vision.

## 2020-03-30 NOTE — ED Triage Notes (Signed)
Pt c/o body aches, "I feel awful" with multiple c/o x 1 year-states she is having work up to check her c/o-states she spoke with PCP today and was advised to come to ED-NAD-steady gait

## 2020-03-30 NOTE — Telephone Encounter (Signed)
Pt called stating that she did not feel well, felt like she needed to go the ER to likely be admitted. She said she thought her lab levels may have worsened and may even need IV fluids. I advised her to go ahead to the ED and I would notify NP and have her or me call her back with anymore advice given.

## 2020-03-31 ENCOUNTER — Telehealth: Payer: BC Managed Care – PPO | Admitting: Family Medicine

## 2020-03-31 ENCOUNTER — Other Ambulatory Visit: Payer: Self-pay | Admitting: Family Medicine

## 2020-03-31 DIAGNOSIS — F419 Anxiety disorder, unspecified: Secondary | ICD-10-CM

## 2020-04-01 ENCOUNTER — Ambulatory Visit: Payer: Self-pay | Admitting: Internal Medicine

## 2020-04-03 ENCOUNTER — Encounter: Payer: Self-pay | Admitting: Emergency Medicine

## 2020-04-03 ENCOUNTER — Ambulatory Visit
Admission: EM | Admit: 2020-04-03 | Discharge: 2020-04-03 | Disposition: A | Discharge status: Home / Self Care | Payer: Self-pay | Attending: Physician Assistant | Admitting: Physician Assistant

## 2020-04-03 ENCOUNTER — Other Ambulatory Visit: Payer: Self-pay

## 2020-04-03 DIAGNOSIS — M25571 Pain in right ankle and joints of right foot: Secondary | ICD-10-CM

## 2020-04-03 DIAGNOSIS — G8929 Other chronic pain: Secondary | ICD-10-CM

## 2020-04-03 MED ORDER — DEXAMETHASONE SODIUM PHOSPHATE 10 MG/ML IJ SOLN
10.0000 mg | Freq: Once | INTRAMUSCULAR | Status: AC
  Administered 2020-04-03: 10 mg via INTRAMUSCULAR

## 2020-04-03 MED ORDER — MELOXICAM 7.5 MG PO TABS
7.5000 mg | ORAL_TABLET | Freq: Every day | ORAL | 0 refills | Status: DC
Start: 2020-04-03 — End: 2020-09-05

## 2020-04-03 NOTE — ED Triage Notes (Signed)
Pt here for chronic right ankle pain seeing multiple specialists recently; pt sts only had relief with round of prednisone; pt here today requesting pain relief

## 2020-04-03 NOTE — ED Provider Notes (Signed)
[facility]    CSN: [id number] Arrival date & time: 04/03/20  1004      History   Chief Complaint Chief Complaint  Patient presents with   Ankle Pain    HPI Rhonda Baker is a 34 y.o. female.   34 year old female comes in for right ankle pain. This is chronic in nature, seeing PCP/rheumatologist for this. Had elevated CRP, and was put on prednisone 5 day course, finished 1 week ago. States this significantly improved pain, but pain returned after finishing course. Has chronic numbness/tingling to the foot. Swelling to the foot. No new injury.      Past Medical History:  Diagnosis Date   Anxiety    Phreesia 11/25/2019   Depression    Phreesia 11/25/2019    Patient Active Problem List   Diagnosis Date Noted   Anxiety 12/16/2019   Weight gain 12/11/2019   Numbness and tingling of both legs 11/27/2019   Other fatigue 11/27/2019   Vitamin D deficiency 11/27/2019   Arthralgia 11/27/2019   Essential hypertension 11/27/2019   Primary insomnia 11/27/2019    Past Surgical History:  Procedure Laterality Date   CESAREAN SECTION     2011   CESAREAN SECTION N/A    Phreesia 11/25/2019    OB History   No obstetric history on file.      Home Medications    Prior to Admission medications   Medication Sig Start Date End Date Taking? Authorizing Provider  amphetamine-dextroamphetamine (ADDERALL) 30 MG tablet Take 30 mg by mouth 2 (two) times daily.    [provider]  busPIRone (BUSPAR) 5 MG tablet Take 1 tablet (5 mg total) by mouth 2 (two) times daily. 01/29/20   Freddy Finner, NP  clonazePAM (KLONOPIN) 1 MG tablet Take 1-2 mg by mouth at bedtime.     [provider]  escitalopram (LEXAPRO) 20 MG tablet Take 1 tablet (20 mg total) by mouth daily. 01/15/20   Freddy Finner, NP  eszopiclone (LUNESTA) 2 MG TABS tablet Take 1 tablet (2 mg total) by mouth at bedtime as needed for sleep. Take immediately before bedtime Patient  taking differently: Take 2 mg by mouth at bedtime. Take immediately before bedtime 12/16/19   Freddy Finner, NP  furosemide (LASIX) 20 MG tablet Take 1 tablet (20 mg total) by mouth daily. 03/22/20   Freddy Finner, NP  hydrocortisone 2.5 % cream Apply 1 application topically as needed (itching).  01/03/20   [provider]  JUNEL FE 1/20 1-20 MG-MCG tablet Take 1 tablet by mouth daily. 02/10/20   [provider]  LINZESS 145 MCG CAPS capsule Take 145 mcg by mouth as needed (IBS).  11/23/19   [provider]  meloxicam (MOBIC) 7.5 MG tablet Take 1-2 tablets (7.5-15 mg total) by mouth daily. 04/03/20   Cathie Hoops, Xzavior Reinig V, PA-C  potassium chloride (KLOR-CON) 10 MEQ tablet Take 1 tablet (10 mEq total) by mouth daily. 03/22/20   Freddy Finner, NP    Family History Family History  Problem Relation Age of Onset   Glaucoma Mother    Diabetes Father    Hyperlipidemia Father    Healthy Son    Lupus Cousin    Celiac disease Cousin     Social History Social History   Tobacco Use   Smoking status: Never Smoker   Smokeless tobacco: Never Used  Building services engineer Use: Never used  Substance Use Topics   Alcohol use:  Not Currently   Drug use: Never     Allergies   Percocet [oxycodone-acetaminophen]   Review of Systems Review of Systems  Reason unable to perform ROS: See HPI as above.     Physical Exam Triage Vital Signs ED Triage Vitals [04/03/20 1149]  Enc Vitals Group     BP 126/74     Pulse Rate (!) 106     Resp 18     Temp 98.5 F (36.9 C)     Temp Source Oral     SpO2 99 %     Weight      Height      Head Circumference      Peak Flow      Pain Score 10     Pain Loc      Pain Edu?      Excl. in GC?    No data found.  Updated Vital Signs BP 126/74 (BP Location: Left Arm)    Pulse (!) 106    Temp 98.5 F (36.9 C) (Oral)    Resp 18    SpO2 99%   Physical Exam Constitutional:      General: She is not in acute distress.     Appearance: Normal appearance. She is well-developed. She is not toxic-appearing or diaphoretic.  HENT:     Head: Normocephalic and atraumatic.  Eyes:     Conjunctiva/sclera: Conjunctivae normal.     Pupils: Pupils are equal, round, and reactive to light.  Pulmonary:     Effort: Pulmonary effort is normal. No respiratory distress.  Musculoskeletal:     Cervical back: Normal range of motion and neck supple.     Comments: Lateral ankle swelling without erythema, warmth. No tenderness to palpation of ankle, foot. Full ROM. Strength 5/5. Pedal pulse 2+  Skin:    General: Skin is warm and dry.  Neurological:     Mental Status: She is alert and oriented to person, place, and time.      UC Treatments / Results  Labs (all labs ordered are listed, but only abnormal results are displayed) Labs Reviewed - No data to display  EKG   Radiology No results found.  Procedures Procedures (including critical care time)  Medications Ordered in UC Medications  dexamethasone (DECADRON) injection 10 mg (has no administration in time range)    Initial Impression / Assessment and Plan / UC Course  I have reviewed the triage vital signs and the nursing notes.  Pertinent labs & imaging results that were available during my care of the patient were reviewed by me and considered in my medical decision making (see chart for details).    Decadron in office today. NSAIDs, ace wrap. Follow up with PCP/specialist for further management of chronic ankle pain. Return precautions given.  Final Clinical Impressions(s) / UC Diagnoses   Final diagnoses:  Chronic pain of right ankle    ED Prescriptions    Medication Sig Dispense Auth. Provider   meloxicam (MOBIC) 7.5 MG tablet Take 1-2 tablets (7.5-15 mg total) by mouth daily. 20 tablet Belinda Fisher, PA-C     I have reviewed the PDMP during this encounter.   Belinda Fisher, PA-C 04/03/20 1217

## 2020-04-03 NOTE — Discharge Instructions (Signed)
Decadron injection in office today. Start Mobic. Do not take ibuprofen (motrin/advil)/ naproxen (aleve) while on mobic. Can trial ace wrap to help with swelling. Follow up with PCP/specialist for further management needed.

## 2020-04-07 ENCOUNTER — Encounter: Payer: Self-pay | Admitting: Family Medicine

## 2020-04-07 ENCOUNTER — Telehealth: Payer: Self-pay | Admitting: Neurology

## 2020-04-07 ENCOUNTER — Other Ambulatory Visit: Payer: Self-pay | Admitting: Family Medicine

## 2020-04-07 MED ORDER — GABAPENTIN 100 MG PO CAPS: 100.0000 mg | ORAL_CAPSULE | Freq: Two times a day (BID) | ORAL | 3 refills | Status: DC

## 2020-04-07 NOTE — Telephone Encounter (Signed)
Telephone call to the pt, Pt reports the pain has gotten worse since her urgent care visit on 04/03/20. The pain now is radiating up her leg and into her bottom. The Right foot is numb and Warm to touch, as well as swollen. Pt states there has bot been any injury to the foot at all. The Meloxicam given by the Urgent care not  helping at all the. Pt was seen by her Psychiatrist  the other day  and she wrote a script for gabapentin 300 mg at bedtime and that is not helping either.   Pt wants to know what she should do. Can she come in the office tomorrow to been and if she could get something for the pain. Pt states she needs the pain to go away she has a lot to do this weekend.    Please advise

## 2020-04-07 NOTE — Telephone Encounter (Signed)
I had ordered an EMG and she was supposed to follow up.  However, if she is having swelling in the foot, that is NOT neurologist and she should probably follow up with her PCP as well.

## 2020-04-07 NOTE — Telephone Encounter (Signed)
Pt advised of the note below.   As well as, I have sent a request by fax and on VM for [place] [facility] to send over the EMG results. [phone]

## 2020-04-15 ENCOUNTER — Other Ambulatory Visit: Payer: Self-pay | Admitting: Family Medicine

## 2020-04-15 DIAGNOSIS — F419 Anxiety disorder, unspecified: Secondary | ICD-10-CM

## 2020-04-20 ENCOUNTER — Encounter: Payer: Self-pay | Admitting: Family Medicine

## 2020-04-20 ENCOUNTER — Telehealth (INDEPENDENT_AMBULATORY_CARE_PROVIDER_SITE_OTHER): Payer: Self-pay | Admitting: Nurse Practitioner

## 2020-04-20 ENCOUNTER — Other Ambulatory Visit: Payer: Self-pay

## 2020-04-20 ENCOUNTER — Encounter: Payer: Self-pay | Admitting: Nurse Practitioner

## 2020-04-20 DIAGNOSIS — L989 Disorder of the skin and subcutaneous tissue, unspecified: Secondary | ICD-10-CM

## 2020-04-20 DIAGNOSIS — R600 Localized edema: Secondary | ICD-10-CM

## 2020-04-20 DIAGNOSIS — M25571 Pain in right ankle and joints of right foot: Secondary | ICD-10-CM

## 2020-04-20 MED ORDER — TRIAMCINOLONE ACETONIDE 0.1 % EX CREA: 1.0000 "application " | TOPICAL_CREAM | Freq: Two times a day (BID) | CUTANEOUS | 0 refills | Status: DC

## 2020-04-20 MED ORDER — PREDNISONE 10 MG PO TABS: ORAL_TABLET | ORAL | 0 refills | Status: DC

## 2020-04-20 MED ORDER — TRAMADOL HCL 50 MG PO TABS
50.0000 mg | ORAL_TABLET | Freq: Three times a day (TID) | ORAL | 0 refills | Status: DC | PRN
Start: 2020-04-20 — End: 2020-04-20

## 2020-04-20 MED ORDER — PREDNISONE 10 MG PO TABS: ORAL_TABLET | ORAL | 0 refills | Status: AC

## 2020-04-20 MED ORDER — TRAMADOL HCL 50 MG PO TABS: 50.0000 mg | ORAL_TABLET | Freq: Three times a day (TID) | ORAL | 0 refills | Status: AC | PRN

## 2020-04-20 NOTE — Telephone Encounter (Signed)
Pt informed. Appt made with Wallace Cullens.

## 2020-04-20 NOTE — Telephone Encounter (Signed)
Great, thank you!

## 2020-04-20 NOTE — Progress Notes (Signed)
Acute Office Visit  Subjective:    Patient ID: Rhonda Baker, female    DOB: 02-11-1986, 34 y.o.   MRN: [id number]  Chief Complaint  Patient presents with  . Ankle Pain    pain and swelling x 2 months    HPI Patient is in today for right ankle pain that has been ongoing.  She states that it is extremely swollen.  She has tried prednisone, gabapentin, and meloxicam. She states that she walks with a limp and her pain is constant when driving.  Her pain is sharp when she moves the wrong way, but it is generally a constant ache.  She states that the skin around her ankle is sore to the touch.  She has been seen by urgent care. She was seen by neurology, and her nerve conduction tests were normal.  She has upcoming appointment with Dr. Amil Amen, rheumatologist.  In the ED, she was checked for gout, and that was negative.  Facial rash on chin that "kind of itches" and never completely goes away.  She states that she has some small dots that won't go away.  Denies presence of plaques.  Past Medical History:  Diagnosis Date  . Anxiety    Phreesia 11/25/2019  . Depression    Phreesia 11/25/2019    Past Surgical History:  Procedure Laterality Date  . CESAREAN SECTION     2011  . CESAREAN SECTION N/A    Phreesia 11/25/2019    Family History  Problem Relation Age of Onset  . Glaucoma Mother   . Diabetes Father   . Hyperlipidemia Father   . Healthy Son   . Lupus Cousin   . Celiac disease Cousin     Social History   Socioeconomic History  . Marital status: Divorced    Spouse name: Not on file  . Number of children: 1  . Years of education: 4  . Highest education level: Not on file  Occupational History    Comment: [employer]   Tobacco Use  . Smoking status: Never Smoker  . Smokeless tobacco: Never Used  Vaping Use  . Vaping Use: Never used  Substance and Sexual Activity  . Alcohol use: Not Currently  . Drug use: Never  . Sexual activity: Not on file   Other Topics Concern  . Not on file  Social History Narrative   Lives with son [name]-Primary Custody   [name]      Enjoy: hanging out with son, friends, going       Diet: eats all food groups    Caffeine: 1 cup of coffee a day   Water: 6-8 cups daily       Wears seat belt    Does not use phone while driving    Oceanographer at home   We[employer] in [employer]    Social Determinants of Health   Financial Resource Strain: Low Risk   . Difficulty of Paying Living Expenses: Not hard at all  Food Insecurity: No Food Insecurity  . Worried About Charity fundraiser in the Last Year: Never true  . Ran Out of Food in the Last Year: Never true  Transportation Needs: No Transportation Needs  . Lack of Transportation (Medical): No  . Lack of Transportation (Non-Medical): No  Physical Activity: Insufficiently Active  . Days of Exercise per Week: 1 day  . Minutes of Exercise per Session: 10 min  Stress: No Stress Concern Present  . Feeling  of Stress : Only a little  Social Connections: Moderately Isolated  . Frequency of Communication with Friends and Family: More than three times a week  . Frequency of Social Gatherings with Friends and Family: More than three times a week  . Attends Religious Services: More than 4 times per year  . Active Member of Clubs or Organizations: No  . Attends Archivist Meetings: Never  . Marital Status: Divorced  Human resources officer Violence: Not At Risk  . Fear of Current or Ex-Partner: No  . Emotionally Abused: No  . Physically Abused: No  . Sexually Abused: No    Outpatient Medications Prior to Visit  Medication Sig Dispense Refill  . amphetamine-dextroamphetamine (ADDERALL) 30 MG tablet Take 30 mg by mouth 2 (two) times daily.    . busPIRone (BUSPAR) 5 MG tablet Take 1 tablet (5 mg total) by mouth 2 (two) times daily. 60 tablet 1  . clonazePAM (KLONOPIN) 1 MG tablet Take 1-2 mg by mouth at bedtime.     Marland Kitchen escitalopram  (LEXAPRO) 20 MG tablet Take 1 tablet (20 mg total) by mouth daily. 90 tablet 0  . eszopiclone (LUNESTA) 2 MG TABS tablet Take 1 tablet (2 mg total) by mouth at bedtime as needed for sleep. Take immediately before bedtime (Patient taking differently: Take 2 mg by mouth at bedtime. Take immediately before bedtime) 30 tablet 3  . furosemide (LASIX) 20 MG tablet Take 1 tablet (20 mg total) by mouth daily. 30 tablet 3  . gabapentin (NEURONTIN) 100 MG capsule Take 1 capsule (100 mg total) by mouth 2 (two) times daily. 60 capsule 3  . hydrocortisone 2.5 % cream Apply 1 application topically as needed (itching).     Lenda Kelp FE 1/20 1-20 MG-MCG tablet Take 1 tablet by mouth daily.    Marland Kitchen LINZESS 145 MCG CAPS capsule Take 145 mcg by mouth as needed (IBS).     Marland Kitchen meloxicam (MOBIC) 7.5 MG tablet Take 1-2 tablets (7.5-15 mg total) by mouth daily. 20 tablet 0  . potassium chloride (KLOR-CON) 10 MEQ tablet Take 1 tablet (10 mEq total) by mouth daily. 30 tablet 3   No facility-administered medications prior to visit.    Allergies  Allergen Reactions  . Percocet [Oxycodone-Acetaminophen]     hives    Review of Systems  Musculoskeletal: Positive for arthralgias and joint swelling.       Right ankle pain/swelling  Skin: Positive for color change and rash.       Right ankle is red, swollen; chin has red rash that won't go away       Objective:    Physical Exam  There were no vitals taken for this visit. Wt Readings from Last 3 Encounters:  03/30/20 223 lb (101.2 kg)  03/15/20 218 lb (98.9 kg)  03/04/20 223 lb 12.8 oz (101.5 kg)    Health Maintenance Due  Topic Date Due  . Hepatitis C Screening  Never done  . HIV Screening  Never done  . TETANUS/TDAP  Never done  . PAP SMEAR-Modifier  Never done  . INFLUENZA VACCINE  Never done    There are no preventive care reminders to display for this patient.   Lab Results  Component Value Date   TSH 1.87 11/19/2019   Lab Results  Component Value  Date   WBC 11.3 (H) 03/30/2020   HGB 13.1 03/30/2020   HCT 38.7 03/30/2020   MCV 96.3 03/30/2020   PLT 453 (H) 03/30/2020   Lab  Results  Component Value Date   NA 137 03/30/2020   K 3.7 03/30/2020   CO2 24 03/30/2020   GLUCOSE 99 03/30/2020   BUN 8 03/30/2020   CREATININE 0.77 03/30/2020   BILITOT 0.4 03/30/2020   ALKPHOS 49 03/30/2020   AST 18 03/30/2020   ALT 23 03/30/2020   PROT 7.7 03/30/2020   ALBUMIN 3.9 03/30/2020   CALCIUM 8.9 03/30/2020   ANIONGAP 12 03/30/2020   Lab Results  Component Value Date   CHOL 195 12/24/2019   Lab Results  Component Value Date   HDL 42 12/24/2019   Lab Results  Component Value Date   LDLCALC 128 (H) 12/24/2019   Lab Results  Component Value Date   TRIG 138 12/24/2019   Lab Results  Component Value Date   CHOLHDL 4.6 (H) 12/24/2019   Lab Results  Component Value Date   HGBA1C 5.8 11/19/2019       Assessment & Plan:   Problem List Items Addressed This Visit    None    Visit Diagnoses    Chronic pain of right ankle    -  Primary   Localized edema         Right ankle pain/swelling -Pain started several months ago -she had imaging of both ankles completed, and there are no issues in her joint space visible on x-ray; x-rays showed soft tissue swelling -today she states her ankles are red and swollen again -she recently finished a prednisone pack, and that helped significantly -she was cleared by neurology -she has upcoming rheumatology appointment -had recent elevation in her CRP, but ESR and nuclear antibodies were negative -her pain and swelling were responsive to steroids -she has a racial rash to her chin, unsure if this is connected -? Gout, but she states this was worked up at ED and was negative -? Psoriatic arthritis, but her joint imaging was unremarkable and she does not describe plaques to her body or extensor surfaces; rheumatology will complete further workup -Rx. Prednisone -Rx. tramadol  Skin  lesion -rash to chin -Rx. Triamcinolone cream   No orders of the defined types were placed in this encounter.  Date:  04/20/2020   Location of Patient: Home Location of Provider: Office Consent was obtain for visit to be over via telehealth. I verified that I am speaking with the correct person using two identifiers.  I connected with  Rhonda Baker on 04/20/20 via telephone and verified that I am speaking with the correct person using two identifiers.   I discussed the limitations of evaluation and management by telemedicine. The patient expressed understanding and agreed to proceed.  AWV questions answered by Lonn Georgia, LPN.  I called the patient to confirm the responses to the AWV questions and addressed all concerns.  Time spent: 20 minutes talking with patient  Noreene Larsson, NP

## 2020-04-20 NOTE — Telephone Encounter (Signed)
Back to back use of prednisone dose pks is not recommended. It is good to know it helped.  Per office policy changes we can not prescribe anymore via a message and need to have a visit even if just phone. Given it is the day before the Holiday- unsure when I can do a visit, but Casimiro Needle might have an opening today. I think a picture will help give context to the visit.

## 2020-04-20 NOTE — Addendum Note (Signed)
Addended by: Bjorn Pippin on: 04/20/2020 01:13 PM   Modules accepted: Orders

## 2020-04-25 ENCOUNTER — Telehealth: Payer: Self-pay | Admitting: Neurology

## 2020-04-25 NOTE — Telephone Encounter (Signed)
Pt advised of EMG results.

## 2020-04-25 NOTE — Telephone Encounter (Signed)
Reviewed NCV-EMG of right lower extremity performed at [facility] on 01/12/2020.  It was normal without evidence of peripheral neuropathy or pinched nerve from back.

## 2020-06-10 ENCOUNTER — Other Ambulatory Visit: Payer: Self-pay | Admitting: Family Medicine

## 2020-06-10 DIAGNOSIS — E876 Hypokalemia: Secondary | ICD-10-CM

## 2020-06-10 DIAGNOSIS — M7989 Other specified soft tissue disorders: Secondary | ICD-10-CM

## 2020-06-29 ENCOUNTER — Other Ambulatory Visit (HOSPITAL_COMMUNITY): Payer: Self-pay | Admitting: Internal Medicine

## 2020-06-29 ENCOUNTER — Ambulatory Visit: Payer: Self-pay | Attending: Internal Medicine

## 2020-06-29 DIAGNOSIS — Z23 Encounter for immunization: Secondary | ICD-10-CM

## 2020-06-29 NOTE — Progress Notes (Signed)
   Covid-19 Vaccination Clinic  Name:  Rhonda Baker    MRN: [id number] DOB: 06-01-1985  06/29/2020  [name] was observed post Covid-19 immunization for 15 minutes without incident. She was provided with Vaccine Information Sheet and instruction to access the V-Safe system.   [name] was instructed to call 911 with any severe reactions post vaccine: Marland Kitchen Difficulty breathing  . Swelling of face and throat  . A fast heartbeat  . A bad rash all over body  . Dizziness and weakness   Immunizations Administered    Name Date Dose VIS Date Route   Pfizer COVID-19 Vaccine 06/29/2020 12:16 PM 0.3 mL 03/16/2020 Intramuscular   Manufacturer: [employer], [employer]   Lot: [id number]   NDC: [address]-7

## 2020-09-02 LAB — HM PAP SMEAR: HM Pap smear: NORMAL

## 2020-09-05 ENCOUNTER — Other Ambulatory Visit: Payer: Self-pay

## 2020-09-05 ENCOUNTER — Encounter: Payer: Self-pay | Admitting: Neurology

## 2020-09-05 ENCOUNTER — Ambulatory Visit: Payer: 59 | Admitting: Neurology

## 2020-09-05 VITALS — BP 129/86 | HR 101 | Ht 65.0 in | Wt 241.0 lb

## 2020-09-05 DIAGNOSIS — R200 Anesthesia of skin: Secondary | ICD-10-CM

## 2020-09-05 DIAGNOSIS — R202 Paresthesia of skin: Secondary | ICD-10-CM | POA: Diagnosis not present

## 2020-09-05 NOTE — Progress Notes (Addendum)
[facility]    Provider:  Dr Rhonda Baker Requesting Provider: Blair Heys, MD Primary Care Provider:  Pcp, Baker  CC:  Numbness in the feet  HPI:  Rhonda Baker is a 35 y.o. female here as requested by Rhonda Heys, MD for numbness in the feet. She hasn;t been able to feel her feet since Dec 2020, she has been on her feet for 14 years, she couldn;t feel the temperature of the shower anymore, then the numbness. Her ankles and feet were in pain like he had a broken ankle and a broken toes. She went to rheumatology for psoriatic arthritis, she has felt better, treated with otezla and feeling great but still with the numbness in the feet, the pain is better thank goodness but still with the numbness. The right hand is weak, not numb Baker sensory changes just weak. The left hand she feels digits 3-4-5 she does "piano fingers", feet swell, furosemide does nothing anymore She doesn't have pain just sensory changes. Goes to below her ankle. When she gets a pedicure she can;t feel it.  Rash in the sun on the feet and shins.   Reviewed notes, labs and imaging from outside physicians, which showed:  03/30/2020: mAg nml, peg neg, cbc with elevated WBCs and platelets otherwise unremarkable, cmp normal, protein electrophoresis with elevated alpha 1. RF  7/2/201: PTH normal, b6 nml, crp 34, heavy metals and Rhonda Baker unreamrkable, lyme neg, vit D low 20,  11/19/2019: hgba1c 5.8, tsh nml, b12 541, sjogren's ab neg, RF nml, ife nml,   08/2019 tsh nml  Patient saw Rhonda Baker in June 2021 who is a neurologist at that time she presented for headache and paresthesias, she was treated for cellulitis in both legs after she fell and scraped her knees in 2020, that was October before Christmas she began experiencing numbness in the heels of both feet subsequently spread to involve her toes and entire feet and at times radiates up to below the knees, Baker skin discoloration, she subsequently developed tingling  and sometimes tingling in her feet, sunlight exposure triggers the pain and numbness, she is also had some back pain and swelling in her feet and ankles at the end of the day but 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  Baker weakness or radicular pain in the legs.  One time she noted numbness from her left scapula down her left lateral arm to the fourth and fifth digits but otherwise Baker involvement in the upper extremities she also has diffuse body aches. Multiple labs were checked including ANA, RF, Sjogren's antibodies, B12, folate, B6, hemoglobin A1c and SPEP, nerve conduction EMG was ordered.  MRI of the brain April 2021 was unremarkable minimal white matter changes, MRI of the cervical spine showed Baker cord abnormality some minor arthritic/degenerative changes with Baker significant canal or neural foraminal stenosis (reviewed reports and agree), sed rate was 19 and TSH was 1.7.  MRI of the thoracic spine showed possibly T8 facet degeneration or possibly acute stress reaction and/or stress response, Baker central canal or foraminal nerve impingement, MRI of the lumbar spine showed mild bilateral facet hypertrophy at L4-L5 and L5-S1 without stenosis otherwise unremarkable MRI of the lumbar spine and thoracic spine as well.   sed, cmp, cbc, lyme,   arsenic  Review of Systems: Patient complains of symptoms per HPI as well as the following symptoms: snoring, restless legs, numbness, weakness, dizziness, tremor, joint pain, swelling, cramps.. Pertinent negatives and positives per HPI. All others negative.   Social History   Socioeconomic History  . Marital status: Divorced    Spouse name: Not on file  . Number of children: 1  . Years of education: 4  . Highest education level: Not on file  Occupational History    Comment: [employer]   Tobacco Use  . Smoking status: Never Smoker  . Smokeless tobacco: Never Used  Vaping Use  . Vaping Use:  Never used  Substance and Sexual Activity  . Alcohol use: Yes    Alcohol/week: 3.0 - 4.0 standard drinks    Types: 3 - 4 Glasses of wine per week  . Drug use: Never  . Sexual activity: Not on file  Other Topics Concern  . Not on file  Social History Narrative   Lives with son Rhonda Baker-Primary Custody   Rhonda Baker and Rhonda Baker Dogs      Enjoy: hanging out with son, friends, going       Diet: eats all food groups    Caffeine: 1 cup of coffee a day   Water: 6-8 cups daily       Wears seat belt    Does not use phone while driving    Psychologist, sport and exercisemoke detectors at home   Weapon in [employer]-lock box       Right handed   Social Determinants of Health   Financial Resource Strain: Low Risk   . Difficulty of Paying Living Expenses: Not hard at all  Food Insecurity: Baker Food Insecurity  . Worried About Programme researcher, broadcasting/film/videounning Out of Food in the Last Year: Never true  . Ran Out of Food in the Last Year: Never true  Transportation Needs: Baker Transportation Needs  . Lack of Transportation (Medical): Baker  . Lack of Transportation (Non-Medical): Baker  Physical Activity: Insufficiently Active  . Days of Exercise per Week: 1 day  . Minutes of Exercise per Session: 10 min  Stress: Baker Stress Concern Present  . Feeling of Stress : Only a little  Social Connections: Moderately Isolated  . Frequency of Communication with Friends and Family: More than three times a week  . Frequency of Social Gatherings with Friends and Family: More than three times a week  . Attends Religious Services: More than 4 times per year  . Active Member of Clubs or Organizations: Baker  . Attends BankerClub or Organization Meetings: Never  . Marital Status: Divorced  Catering managerntimate Partner Violence: Not At Risk  . Fear of Current or Ex-Partner: Baker  . Emotionally Abused: Baker  . Physically Abused: Baker  . Sexually Abused: Baker    Family History  Problem Relation Age of Onset  . Glaucoma Mother   . Diabetes Father   . Hyperlipidemia Father   . Healthy Son   . Lupus Cousin    . Celiac disease Cousin   . Neuropathy Maternal Grandfather   . Multiple sclerosis Cousin        paternal 1st cousin    Past Medical History:  Diagnosis Date  . Allergies   . Anxiety    Phreesia 11/25/2019  . Depression    Phreesia 11/25/2019  . Psoriatic arthritis (HCC)   . RA (rheumatoid arthritis) (HCC)    SERONEGATIVE    Patient Active Problem List   Diagnosis Date Noted  . Anxiety 12/16/2019  .  Weight gain 12/11/2019  . Numbness and tingling of both legs 11/27/2019  . Other fatigue 11/27/2019  . Vitamin D deficiency 11/27/2019  . Arthralgia 11/27/2019  . Essential hypertension 11/27/2019  . Primary insomnia 11/27/2019    Past Surgical History:  Procedure Laterality Date  . CESAREAN SECTION     2011  . CESAREAN SECTION N/A    Phreesia 11/25/2019  . LAPAROSCOPY  2008   GYN    Current Outpatient Medications  Medication Sig Dispense Refill  . amphetamine-dextroamphetamine (ADDERALL) 30 MG tablet Take 30 mg by mouth 2 (two) times daily.    Marland Kitchen Apremilast (OTEZLA) 30 MG TABS Take 1 tablet by mouth in the morning and at bedtime.    . clonazePAM (KLONOPIN) 1 MG tablet Take 1-2 mg by mouth at bedtime.     Marland Kitchen doxepin (SINEQUAN) 75 MG capsule Take 150 mg by mouth at bedtime.    Marland Kitchen escitalopram (LEXAPRO) 20 MG tablet Take 1 tablet (20 mg total) by mouth daily. 90 tablet 0  . furosemide (LASIX) 20 MG tablet TAKE 1 TABLET BY MOUTH EVERY DAY 90 tablet 1  . gabapentin (NEURONTIN) 300 MG capsule Take 300 mg by mouth 3 (three) times daily.    Colleen Can FE 1/20 1-20 MG-MCG tablet Take 1 tablet by mouth daily.    . ondansetron (ZOFRAN) 4 MG tablet Take 4 mg by mouth as needed for nausea or vomiting.    . potassium chloride (KLOR-CON) 10 MEQ tablet TAKE 1 TABLET BY MOUTH EVERY DAY 90 tablet 1   Baker current facility-administered medications for this visit.    Allergies as of 09/05/2020 - Review Complete 09/05/2020  Allergen Reaction Noted  . Percocet [oxycodone-acetaminophen]   09/15/2019    Vitals: BP 129/86 (BP Location: Right Arm, Patient Position: Sitting, Cuff Size: Large)   Pulse (!) 101   Ht 5\' 5"  (1.651 m)   Wt 241 lb (109.3 kg)   BMI 40.10 kg/m  Last Weight:  Wt Readings from Last 1 Encounters:  09/05/20 241 lb (109.3 kg)   Last Height:   Ht Readings from Last 1 Encounters:  09/05/20 5\' 5"  (1.651 m)     Physical exam: Exam: Gen: NAD, conversant, well nourised, obese, well groomed                     CV: RRR, Baker MRG. Baker Carotid Bruits. Baker peripheral edema, warm, nontender Eyes: Conjunctivae clear without exudates or hemorrhage  Neuro: Detailed Neurologic Exam  Speech:    Speech is normal; fluent and spontaneous with normal comprehension.  Cognition:    The patient is oriented to person, place, and time;     recent and remote memory intact;     language fluent;     normal attention, concentration,     fund of knowledge Cranial Nerves:    The pupils are equal, round, and reactive to light. The fundi are flat. Visual fields are full to finger confrontation. Extraocular movements are intact. Trigeminal sensation is intact and the muscles of mastication are normal. The face is symmetric. The palate elevates in the midline. Hearing intact. Voice is normal. Shoulder shrug is normal. The tongue has normal motion without fasciculations.   Coordination:    Normal finger to nose and heel to shin.  Gait:    normla native gait   Motor Observation:    Baker asymmetry, Baker atrophy, and Baker involuntary movements noted. Tone:    Normal muscle tone.    Posture:  Posture is normal. normal erect    Strength:    Strength is V/V in the upper and lower limbs.      Sensation: decreased pin prick and temp to the ankle on the right and forefoot on the left,      Reflex Exam:  DTR's:    Deep tendon reflexes in the upper and lower extremities are normal bilaterally.   Toes:    The toes are downgoing bilaterally.   Clonus:    Clonus is absent.     Assessment/Plan:  35 y.o. female here as requested by Rhonda Heys, MD for numbness in the feet. PMHx RA, psoriatic arthritis, depression, anxiety. She hasn't been able to feel her feet since Dec 2020, she has been on her feet for 14 years, she can't even feel the temperature of the shower anymore in her feet, she is sensitive to light and gets a rash in her legs and swelling in the sunlight.  - check for porphyria.  - she "plays the piano with her hand" all day, TD? Some other movement disorder?unclear.  - Extensive labs already completed, check b1 - Blood and urine today - Will send genetic testing - May consider small fiber skin biopsy - consider  skin biopsy  - emg/ncs of the bilateral uppers and one lower including   (Addendum 09/22/2020: Labs: 03/30/2020: mAg nml, peg neg, cbc with elevated WBCs and platelets otherwise unremarkable, cmp normal, protein electrophoresis with elevated alpha 1, RF. 7/2/201: PTH normal, b6 nml, crp 34, heavy metals and Rhonda Baker unreamrkable, lyme neg, vit D low 20. 11/19/2019: hgba1c 5.8, tsh nml, b12 541, sjogren's ab neg, RF nml, ife nml. 08/2019 tsh nml. 09/05/2020: Porphyria neg, mag, cmp, cbc, amylase, b1 unremarkable except for elevated platelets and WBCs which are stable. She has RA and psoriatic arthritis and follows with rheumatology and has had extensive workup there as well. She has had MRI brain, cervical spine, thoracic and lumbar spine without etiology. EMG/NCS today normal. (extensive labs ana, ife, rf, spep,sjogrens,b6.b12.folate,hgba1c,vit d, lyme, Rhonda Baker, heavy metals, crp, b6, celiac disease, sed, b1, mag, amylase, lipase, cbc, cmp, porphyrins, mag - see lab results and values above)  - discussed above in detail - Genetic testing for 80 genes involved in polyneuropathy - pending - Small fiber skin testing/biopsy - ordered - recheck ife/spep - today - If small fiber and Baker other causes found, may be associated with her autoimmune diseases -  elevated WBCs and platelets for many years, follow with pcp fo further recommendations)  Orders Placed This Encounter  Procedures  . Vitamin B1  . Amylase  . Lipase  . CBC  . Comprehensive metabolic panel  . Magnesium  . Creatinine, urine, random  . Porphyrins, fractionated, random urine  . Porphobilinogen, random urine  . ALA Delta, random urine  . NCV with EMG(electromyography)    Baker orders of the defined types were placed in this encounter.   Cc: Rhonda Heys, MD,  Rhonda Baker  Naomie Dean, MD  [facility] [address] [place], [place] [address]  Phone [phone] Fax [phone]  I spent over 105 minutes of face-to-face and non-face-to-face time with patient on the  1. Numbness and tingling in both hands   2. Numbness and tingling of both legs below knees    diagnosis.  This included previsit chart review, lab review, study review, order entry, electronic health record documentation, patient education on the different diagnostic and therapeutic options, counseling and coordination of care, risks and  benefits of management, compliance, or risk factor reduction

## 2020-09-05 NOTE — Patient Instructions (Addendum)
Blood and urine today EMG/NCS Will send genetic testing May consider small fiber skin biopsy  Elige Radon and Daroff's neurology in clinical practice (8th ed., pp. 1443- 1929). Elsevier."> Goldman-Cecil medicine (26th ed., pp. 2489- 2501). Elsevier.">  Peripheral Neuropathy Peripheral neuropathy is a type of nerve damage. It affects nerves that carry signals between the spinal cord and the arms, legs, and the rest of the body (peripheral nerves). It does not affect nerves in the spinal cord or brain. In peripheral neuropathy, one nerve or a group of nerves may be damaged. Peripheral neuropathy is a broad category that includes many specific nerve disorders, like diabetic neuropathy, hereditary neuropathy, and carpal tunnel syndrome. What are the causes? This condition may be caused by:  Diabetes. This is the most common cause of peripheral neuropathy.  Nerve injury.  Pressure or stress on a nerve that lasts a long time.  Lack (deficiency) of B vitamins. This can result from alcoholism, poor diet, or a restricted diet.  Infections.  Autoimmune diseases, such as rheumatoid arthritis and systemic lupus erythematosus.  Nerve diseases that are passed from parent to child (inherited).  Some medicines, such as cancer medicines (chemotherapy).  Poisonous (toxic) substances, such as lead and mercury.  Too little blood flowing to the legs.  Kidney disease.  Thyroid disease. In some cases, the cause of this condition is not known. What are the signs or symptoms? Symptoms of this condition depend on which of your nerves is damaged. Common symptoms include:  Loss of feeling (numbness) in the feet, hands, or both.  Tingling in the feet, hands, or both.  Burning pain.  Very sensitive skin.  Weakness.  Not being able to move a part of the body (paralysis).  Muscle twitching.  Clumsiness or poor coordination.  Loss of balance.  Not being able to control your bladder.  Feeling  dizzy.  Sexual problems. How is this diagnosed? Diagnosing and finding the cause of peripheral neuropathy can be difficult. Your health care provider will take your medical history and do a physical exam. A neurological exam will also be done. This involves checking things that are affected by your brain, spinal cord, and nerves (nervous system). For example, your health care provider will check your reflexes, how you move, and what you can feel. You may have other tests, such as:  Blood tests.  Electromyogram (EMG) and nerve conduction tests. These tests check nerve function and how well the nerves are controlling the muscles.  Imaging tests, such as CT scans or MRI to rule out other causes of your symptoms.  Removing a small piece of nerve to be examined in a lab (nerve biopsy).  Removing and examining a small amount of the fluid that surrounds the brain and spinal cord (lumbar puncture). How is this treated? Treatment for this condition may involve:  Treating the underlying cause of the neuropathy, such as diabetes, kidney disease, or vitamin deficiencies.  Stopping medicines that can cause neuropathy, such as chemotherapy.  Medicine to help relieve pain. Medicines may include: ? Prescription or over-the-counter pain medicine. ? Antiseizure medicine. ? Antidepressants. ? Pain-relieving patches that are applied to painful areas of skin.  Surgery to relieve pressure on a nerve or to destroy a nerve that is causing pain.  Physical therapy to help improve movement and balance.  Devices to help you move around (assistive devices). Follow these instructions at home: Medicines  Take over-the-counter and prescription medicines only as told by your health care provider. Do not take any  other medicines without first asking your health care provider.  Do not drive or use heavy machinery while taking prescription pain medicine. Lifestyle  Do not use any products that contain nicotine  or tobacco, such as cigarettes and e-cigarettes. Smoking keeps blood from reaching damaged nerves. If you need help quitting, ask your health care provider.  Avoid or limit alcohol. Too much alcohol can cause a vitamin B deficiency, and vitamin B is needed for healthy nerves.  Eat a healthy diet. This includes: ? Eating foods that are high in fiber, such as fresh fruits and vegetables, whole grains, and beans. ? Limiting foods that are high in fat and processed sugars, such as fried or sweet foods.   General instructions  If you have diabetes, work closely with your health care provider to keep your blood sugar under control.  If you have numbness in your feet: ? Check every day for signs of injury or infection. Watch for redness, warmth, and swelling. ? Wear padded socks and comfortable shoes. These help protect your feet.  Develop a good support system. Living with peripheral neuropathy can be stressful. Consider talking with a mental health specialist or joining a support group.  Use assistive devices and attend physical therapy as told by your health care provider. This may include using a walker or a cane.  Keep all follow-up visits as told by your health care provider. This is important.   Contact a health care provider if:  You have new signs or symptoms of peripheral neuropathy.  You are struggling emotionally from dealing with peripheral neuropathy.  Your pain is not well-controlled. Get help right away if:  You have an injury or infection that is not healing normally.  You develop new weakness in an arm or leg.  You have fallen or do so frequently. Summary  Peripheral neuropathy is when the nerves in the arms, or legs are damaged, resulting in numbness, weakness, or pain.  There are many causes of peripheral neuropathy, including diabetes, pinched nerves, vitamin deficiencies, autoimmune disease, and hereditary conditions.  Diagnosing and finding the cause of  peripheral neuropathy can be difficult. Your health care provider will take your medical history, do a physical exam, and do tests, including blood tests and nerve function tests.  Treatment involves treating the underlying cause of the neuropathy and taking medicines to help control pain. Physical therapy and assistive devices may also help. This information is not intended to replace advice given to you by your health care provider. Make sure you discuss any questions you have with your health care provider. Document Revised: 02/23/2020 Document Reviewed: 02/23/2020 Elsevier Patient Education  2021 [employer].

## 2020-09-14 LAB — CBC
Hematocrit: 41.4 % (ref 34.0–46.6)
Hemoglobin: 13.7 g/dL (ref 11.1–15.9)
MCH: 30.8 pg (ref 26.6–33.0)
MCHC: 33.1 g/dL (ref 31.5–35.7)
MCV: 93 fL (ref 79–97)
Platelets: 521 10*3/uL — ABNORMAL HIGH (ref 150–450)
RBC: 4.45 x10E6/uL (ref 3.77–5.28)
RDW: 12.5 % (ref 11.7–15.4)
WBC: 11.8 10*3/uL — ABNORMAL HIGH (ref 3.4–10.8)

## 2020-09-14 LAB — COMPREHENSIVE METABOLIC PANEL
ALT: 19 IU/L (ref 0–32)
AST: 17 IU/L (ref 0–40)
Albumin/Globulin Ratio: 1.4 (ref 1.2–2.2)
Albumin: 4.3 g/dL (ref 3.8–4.8)
Alkaline Phosphatase: 69 IU/L (ref 44–121)
BUN/Creatinine Ratio: 10 (ref 9–23)
BUN: 8 mg/dL (ref 6–20)
Bilirubin Total: 0.2 mg/dL (ref 0.0–1.2)
CO2: 19 mmol/L — ABNORMAL LOW (ref 20–29)
Calcium: 9.3 mg/dL (ref 8.7–10.2)
Chloride: 101 mmol/L (ref 96–106)
Creatinine, Ser: 0.82 mg/dL (ref 0.57–1.00)
Globulin, Total: 3 g/dL (ref 1.5–4.5)
Glucose: 93 mg/dL (ref 65–99)
Potassium: 4.7 mmol/L (ref 3.5–5.2)
Sodium: 136 mmol/L (ref 134–144)
Total Protein: 7.3 g/dL (ref 6.0–8.5)
eGFR: 95 mL/min/{1.73_m2} (ref >59)

## 2020-09-14 LAB — ALA DELTA, RANDOM URINE: Delta Ala, Ur: 0.3 mg/L (ref 0.0–5.4)

## 2020-09-14 LAB — PORPHYRINS, FRACTIONATED, RANDOM URINE
Coproporphyrin (CP) I: 3 ug/L (ref 0–15)
Coproporphyrin (CP) III: 12 ug/L (ref 0–49)
Heptacarboxyl (7-CP): 1 ug/L (ref 0–2)
Hexacarboxyl (6-CP): <1 ug/L (ref 0–1)
Pentacarboxyl (5-CP): <1 ug/L (ref 0–2)
Uroporphyrins (UP): 2 ug/L (ref 0–20)

## 2020-09-14 LAB — VITAMIN B1: Thiamine: 140.3 nmol/L (ref 66.5–200.0)

## 2020-09-14 LAB — AMYLASE: Amylase: 47 U/L (ref 31–110)

## 2020-09-14 LAB — CREATININE, URINE, RANDOM: Creatinine, Urine: 10 mg/dL

## 2020-09-14 LAB — MAGNESIUM: Magnesium: 2.4 mg/dL — ABNORMAL HIGH (ref 1.6–2.3)

## 2020-09-14 LAB — LIPASE: Lipase: 41 U/L (ref 14–72)

## 2020-09-14 LAB — PORPHOBILINOGEN, RANDOM URINE: Porphobilinogen, Rand Ur: 0.2 mg/L (ref 0.0–2.0)

## 2020-09-22 ENCOUNTER — Ambulatory Visit: Payer: 59 | Admitting: Neurology

## 2020-09-22 ENCOUNTER — Ambulatory Visit (INDEPENDENT_AMBULATORY_CARE_PROVIDER_SITE_OTHER): Payer: 59 | Admitting: Neurology

## 2020-09-22 ENCOUNTER — Other Ambulatory Visit: Payer: Self-pay

## 2020-09-22 DIAGNOSIS — R200 Anesthesia of skin: Secondary | ICD-10-CM | POA: Diagnosis not present

## 2020-09-22 DIAGNOSIS — Z0289 Encounter for other administrative examinations: Secondary | ICD-10-CM

## 2020-09-22 DIAGNOSIS — R202 Paresthesia of skin: Secondary | ICD-10-CM | POA: Diagnosis not present

## 2020-09-22 NOTE — Progress Notes (Signed)
Full Name: Lucinda Spells Gender: Female MRN #: [id number] Date of Birth: 1985/06/07    Visit Date: 09/22/2020 07:53 Age: 35 Years Examining Physician: Naomie Dean, MD  Requesting Provider: Blair Heys, MD    History: Numbness in the feet and pain in the hands Summary: EMG/NCS was performed on the right upper and right lower extremities. All nerves and muscles (as indicated in the following tables) were within normal limits.    Conclusion: This is a normal study of the right upper and right lower extremities.  No electrophysiologic evidence for mononeuropathy, polyneuropathy, radiculopathy or muscle disease. Small fiber neuropathy can evade detection on this exam however the galvanic sympathetic skin response in the right foot,  an indirect measure of small fiber function, was normal as well.    Naomie Dean M.D.  [facility] [address] [place], [place] [address] Tel: [phone] Fax: [phone]  Verbal informed consent was obtained from the patient, patient was informed of potential risk of procedure, including bruising, bleeding, hematoma formation, infection, muscle weakness, muscle pain, numbness, among others.        MNC    Nerve / Sites Muscle Latency Ref. Amplitude Ref. Rel Amp Segments Distance Velocity Ref. Area    ms ms mV mV %  cm m/s m/s mVms  R Median - APB     Wrist APB 3.0 ?4.4 7.7 ?4.0 100 Wrist - APB 7   22.0     Upper arm APB 6.5  7.3  95.5 Upper arm - Wrist 20 57 ?49 18.9  R Ulnar - ADM     Wrist ADM 2.3 ?3.3 12.7 ?6.0 100 Wrist - ADM 7   29.7     B.Elbow ADM 5.4  12.4  97.8 B.Elbow - Wrist 18 59 ?49 29.2     A.Elbow ADM 6.9  12.1  97.6 A.Elbow - B.Elbow 8 54 ?49 28.9  R Peroneal - EDB     Ankle EDB 3.9 ?6.5 2.9 ?2.0 100 Ankle - EDB 9   10.2     Fib head EDB 9.6  2.4  82.9 Fib head - Ankle 25 44 ?44 9.5     Pop fossa EDB 11.9  2.3  96.9 Pop fossa - Fib head 10 44 ?44 10.0         Pop fossa - Ankle      R Tibial -  AH     Ankle AH 4.5 ?5.8 7.9 ?4.0 100 Ankle - AH 9   20.5     Pop fossa AH 12.5  6.4  80.9 Pop fossa - Ankle 38 47 ?41 19.4             SSR    Nerve / Sites Latency   s  R Sympathetic - Foot     Foot 2.33        SNC    Nerve / Sites Rec. Site Peak Lat Ref.  Amp Ref. Segments Distance Peak Diff Ref.    ms ms V V  cm ms ms  R Sural - Ankle (Calf)     Calf Ankle 3.0 ?4.4 11 ?6 Calf - Ankle 14    R Superficial peroneal - Ankle     Lat leg Ankle 3.0 ?4.4 7 ?6 Lat leg - Ankle 14    R Median, Ulnar - Transcarpal comparison     Median Palm Wrist 1.8 ?2.2 101 ?35 Median Palm - Wrist 8  Ulnar Palm Wrist 1.8 ?2.2 24 ?12 Ulnar Palm - Wrist 8          Median Palm - Ulnar Palm  0.0 ?0.4  R Median - Orthodromic (Dig II, Mid palm)     Dig II Wrist 2.5 ?3.4 24 ?10 Dig II - Wrist 13    R Ulnar - Orthodromic, (Dig V, Mid palm)     Dig V Wrist 2.5 ?3.1 12 ?5 Dig V - Wrist 50                 F  Wave    Nerve F Lat Ref.   ms ms  R Tibial - AH 50.9 ?56.0  R Ulnar - ADM 24.9 ?32.0         EMG Summary Table    Spontaneous MUAP Recruitment  Muscle IA Fib PSW Fasc Other Amp Dur. Poly Pattern  R. Deltoid Normal None None None _______ Normal Normal Normal Normal  R. Triceps brachii Normal None None None _______ Normal Normal Normal Normal  R. Pronator teres Normal None None None _______ Normal Normal Normal Normal  R. First dorsal interosseous Normal None None None _______ Normal Normal Normal Normal  R. Opponens pollicis Normal None None None _______ Normal Normal Normal Normal  R. Vastus medialis Normal None None None _______ Normal Normal Normal Normal  R. Tibialis anterior Normal None None None _______ Normal Normal Normal Normal  R. Gastrocnemius (Medial head) Normal None None None _______ Normal Normal Normal Normal  R. Extensor hallucis longus Normal None None None _______ Normal Normal Normal Normal  R. Abductor hallucis Normal None None None _______ Normal Normal Normal Normal

## 2020-09-22 NOTE — Progress Notes (Signed)
History: This is a 35 year old female here for evaluation of numbness in the feet and pain in the hands, she has a past medical history of autoimmune disorders RA, psoriatic arthritis, depression, anxiety, she has not been able to feel her feet since December 2020, she can even feel the temperature of the shower anymore, she is sensitive to light and gets a rash in her legs and swelling in the sunlight, she already had extensive lab testing completed, we added a B1 and checking for porphyria, we ordered genetic testing for amyloidosis or other, today she is here for EMG nerve conduction study of the bilateral uppers and 1 lower.  Labs: 03/30/2020: mAg nml, peg neg, cbc with elevated WBCs and platelets otherwise unremarkable, cmp normal, protein electrophoresis with elevated alpha 1, RF. 7/2/201: PTH normal, b6 nml, crp 34, heavy metals and copper unreamrkable, lyme neg, vit D low 20. 11/19/2019: hgba1c 5.8, tsh nml, b12 541, sjogren's ab neg, RF nml, ife nml. 08/2019 tsh nml. 09/05/2020: Porphyria neg, mag, cmp, cbc, amylase, b1 unremarkable except for elevated platelets and WBCs which are stable. She has RA and psoriatic arthritis and follows with rheumatology and has had extensive workup there as well. She has had MRI brain, cervical spine, thoracic and lumbar spine without etiology. EMG/NCS today normal. (extensive labs ana, ife, rf, spep,sjogrens,b6.b12.folate,hgba1c,vit d, lyme, copper, heavy metals, crp, b6, celiac disease, sed, b1, mag, amylase, lipase, cbc, cmp, porphyrins, mag - see lab results and values above)  - discussed above in detail - Genetic testing for 80 genes involved in polyneuropathy - pending - Small fiber skin testing/biopsy - ordered - recheck ife/spep - today - If small fiber and no other causes found, may be associated with her autoimmune diseases - elevated WBCs and platelets for many years, follow with pcp fo further recommendations  I spent 20 minutes of face-to-face and  non-face-to-face time with patient on the  1. Numbness and tingling in both hands   2. Numbness and tingling of both legs below knees    diagnosis.  This included previsit chart review, lab review, study review, order entry, electronic health record documentation, patient education on the different diagnostic and therapeutic options, counseling and coordination of care, risks and benefits of management, compliance, or risk factor reduction. This does not include time spent on emg/ncs

## 2020-09-22 NOTE — Progress Notes (Signed)
See procedure note.

## 2020-09-22 NOTE — Procedures (Signed)
Full Name: Katoya Amato Gender: Female MRN #: [id number] Date of Birth: [birth year]    Visit Date: 09/22/2020 07:53 Age: 35 Years Examining Physician: Naomie Dean, MD  Requesting Provider: Blair Heys, MD    History: Numbness in the feet and pain in the hands Summary: EMG/NCS was performed on the right upper and right lower extremities. All nerves and muscles (as indicated in the following tables) were within normal limits.    Conclusion: This is a normal study of the right upper and right lower extremities.  No electrophysiologic evidence for mononeuropathy, polyneuropathy, radiculopathy or muscle disease.  Naomie Dean M.D.  [facility] [address], [address] [place], [place] [address] Tel: [phone] Fax: [phone]  Verbal informed consent was obtained from the patient, patient was informed of potential risk of procedure, including bruising, bleeding, hematoma formation, infection, muscle weakness, muscle pain, numbness, among others.        MNC    Nerve / Sites Muscle Latency Ref. Amplitude Ref. Rel Amp Segments Distance Velocity Ref. Area    ms ms mV mV %  cm m/s m/s mVms  R Median - APB     Wrist APB 3.0 ?4.4 7.7 ?4.0 100 Wrist - APB 7   22.0     Upper arm APB 6.5  7.3  95.5 Upper arm - Wrist 20 57 ?49 18.9  R Ulnar - ADM     Wrist ADM 2.3 ?3.3 12.7 ?6.0 100 Wrist - ADM 7   29.7     B.Elbow ADM 5.4  12.4  97.8 B.Elbow - Wrist 18 59 ?49 29.2     A.Elbow ADM 6.9  12.1  97.6 A.Elbow - B.Elbow 8 54 ?49 28.9  R Peroneal - EDB     Ankle EDB 3.9 ?6.5 2.9 ?2.0 100 Ankle - EDB 9   10.2     Fib head EDB 9.6  2.4  82.9 Fib head - Ankle 25 44 ?44 9.5     Pop fossa EDB 11.9  2.3  96.9 Pop fossa - Fib head 10 44 ?44 10.0         Pop fossa - Ankle      R Tibial - AH     Ankle AH 4.5 ?5.8 7.9 ?4.0 100 Ankle - AH 9   20.5     Pop fossa AH 12.5  6.4  80.9 Pop fossa - Ankle 38 47 ?41 19.4             SSR    Nerve / Sites Latency   s  R Sympathetic  - Foot     Foot 2.33        SNC    Nerve / Sites Rec. Site Peak Lat Ref.  Amp Ref. Segments Distance Peak Diff Ref.    ms ms V V  cm ms ms  R Sural - Ankle (Calf)     Calf Ankle 3.0 ?4.4 11 ?6 Calf - Ankle 14    R Superficial peroneal - Ankle     Lat leg Ankle 3.0 ?4.4 7 ?6 Lat leg - Ankle 14    R Median, Ulnar - Transcarpal comparison     Median Palm Wrist 1.8 ?2.2 101 ?35 Median Palm - Wrist 8       Ulnar Palm Wrist 1.8 ?2.2 24 ?12 Ulnar Palm - Wrist 8          Median Palm - Ulnar Palm  0.0 ?0.4  R  Median - Orthodromic (Dig II, Mid palm)     Dig II Wrist 2.5 ?3.4 24 ?10 Dig II - Wrist 13    R Ulnar - Orthodromic, (Dig V, Mid palm)     Dig V Wrist 2.5 ?3.1 12 ?5 Dig V - Wrist 52                 F  Wave    Nerve F Lat Ref.   ms ms  R Tibial - AH 50.9 ?56.0  R Ulnar - ADM 24.9 ?32.0         EMG Summary Table    Spontaneous MUAP Recruitment  Muscle IA Fib PSW Fasc Other Amp Dur. Poly Pattern  R. Deltoid Normal None None None _______ Normal Normal Normal Normal  R. Triceps brachii Normal None None None _______ Normal Normal Normal Normal  R. Pronator teres Normal None None None _______ Normal Normal Normal Normal  R. First dorsal interosseous Normal None None None _______ Normal Normal Normal Normal  R. Opponens pollicis Normal None None None _______ Normal Normal Normal Normal  R. Vastus medialis Normal None None None _______ Normal Normal Normal Normal  R. Tibialis anterior Normal None None None _______ Normal Normal Normal Normal  R. Gastrocnemius (Medial head) Normal None None None _______ Normal Normal Normal Normal  R. Extensor hallucis longus Normal None None None _______ Normal Normal Normal Normal  R. Abductor hallucis Normal None None None _______ Normal Normal Normal Normal

## 2020-09-26 LAB — MULTIPLE MYELOMA PANEL, SERUM
Albumin SerPl Elph-Mcnc: 3.6 g/dL (ref 2.9–4.4)
Albumin/Glob SerPl: 1.1 (ref 0.7–1.7)
Alpha 1: 0.3 g/dL (ref 0.0–0.4)
Alpha2 Glob SerPl Elph-Mcnc: 0.8 g/dL (ref 0.4–1.0)
B-Globulin SerPl Elph-Mcnc: 1.2 g/dL (ref 0.7–1.3)
Gamma Glob SerPl Elph-Mcnc: 1.1 g/dL (ref 0.4–1.8)
Globulin, Total: 3.5 g/dL (ref 2.2–3.9)
IgA/Immunoglobulin A, Serum: 258 mg/dL (ref 87–352)
IgG (Immunoglobin G), Serum: 1154 mg/dL (ref 586–1602)
IgM (Immunoglobulin M), Srm: 55 mg/dL (ref 26–217)
Total Protein: 7.1 g/dL (ref 6.0–8.5)

## 2020-09-28 ENCOUNTER — Telehealth: Payer: Self-pay | Admitting: Neurology

## 2020-09-28 NOTE — Telephone Encounter (Signed)
Report faxed.

## 2020-09-28 NOTE — Telephone Encounter (Signed)
Pt called, Dr. Dierdre Forth asking for the report to be faxed to him. He does not have MyChart. Would like a call from the nurse.  [facility] Fax: [phone]

## 2020-10-08 ENCOUNTER — Other Ambulatory Visit: Payer: Self-pay | Admitting: Family Medicine

## 2020-10-08 DIAGNOSIS — M7989 Other specified soft tissue disorders: Secondary | ICD-10-CM

## 2020-10-27 ENCOUNTER — Telehealth: Payer: Self-pay | Admitting: Neurology

## 2020-10-27 NOTE — Telephone Encounter (Signed)
I spoke with patient regarding genetic testing. She would like to know what company was used for the testing & if her results are in.

## 2020-10-27 NOTE — Telephone Encounter (Signed)
Spoke with patient and advised that [facility] completed the genetic testing. They tested 102 genes and looked for variants that are associated with genetic disorders associated with neuropathies and amyloidosis.  The testing was negative, no identification of any pathogenic variants known to cause disease.  The patient verbalized understanding and appreciation for the call.  She would like the results mailed to her.  Results mailed to pt per her request.

## 2020-10-27 NOTE — Telephone Encounter (Signed)
Yes, we have received the results. Neuropathies and Amyloidosis are both negative.

## 2020-10-27 NOTE — Telephone Encounter (Signed)
Toma Copier can you check for any recent results for patient from [facility]?

## 2020-11-15 ENCOUNTER — Ambulatory Visit: Payer: 59 | Admitting: Neurology

## 2020-11-30 ENCOUNTER — Ambulatory Visit: Payer: 59 | Admitting: Neurology

## 2020-11-30 VITALS — BP 123/81 | HR 94

## 2020-11-30 DIAGNOSIS — R200 Anesthesia of skin: Secondary | ICD-10-CM | POA: Diagnosis not present

## 2020-11-30 DIAGNOSIS — R202 Paresthesia of skin: Secondary | ICD-10-CM | POA: Diagnosis not present

## 2020-11-30 NOTE — Progress Notes (Signed)
Patient was in right lateral recombinant position. Sterile technique. 1% lidocaine with epinephrine was used for local anesthesia. Punctuated skin biopsy was performed. 3 mm skin sample were obtained at at left foot, above left extensor digitorum brevis, and left lateral calf, 10 cm above lateral malleolus, lateral thigh, 20 cm below superior iliac spine.  Patient tolerated the procedure well.  The wound was covered with neosporin antibiotic cream and bandage. 

## 2020-12-02 DIAGNOSIS — R202 Paresthesia of skin: Secondary | ICD-10-CM | POA: Insufficient documentation

## 2021-01-10 ENCOUNTER — Telehealth: Payer: Self-pay | Admitting: *Deleted

## 2021-01-10 NOTE — Telephone Encounter (Signed)
[facility] results received:  A) Lt thigh: skin w/ normal epidermal nerve fiber density.  B) Lt calf: skin w/ normal epidermal nerve fiber density.  C) Lt foot: skin w/ normal epidermal nerve fiber density.  Dr. Terrace Arabia has review results and would like the patient called with these findings. Official report send for scanning.  _____________________________________  Left patient a detailed message, with results, on voicemail (ok per DPR).  Provided our number to call back with any questions.

## 2021-05-03 IMAGING — DX DG ANKLE COMPLETE 3+V*R*
3 series · 3 of 3 positions shown · non-contrast
Comparison: X-ray right foot 03/04/2020.

CLINICAL DATA: Sudden right ankle pain onset 2 weeks ago. Not
improving. Lateral malleolar pain most faithful with weight-bearing.

EXAM:
RIGHT ANKLE - COMPLETE 3+ VIEW

[ankle ap]
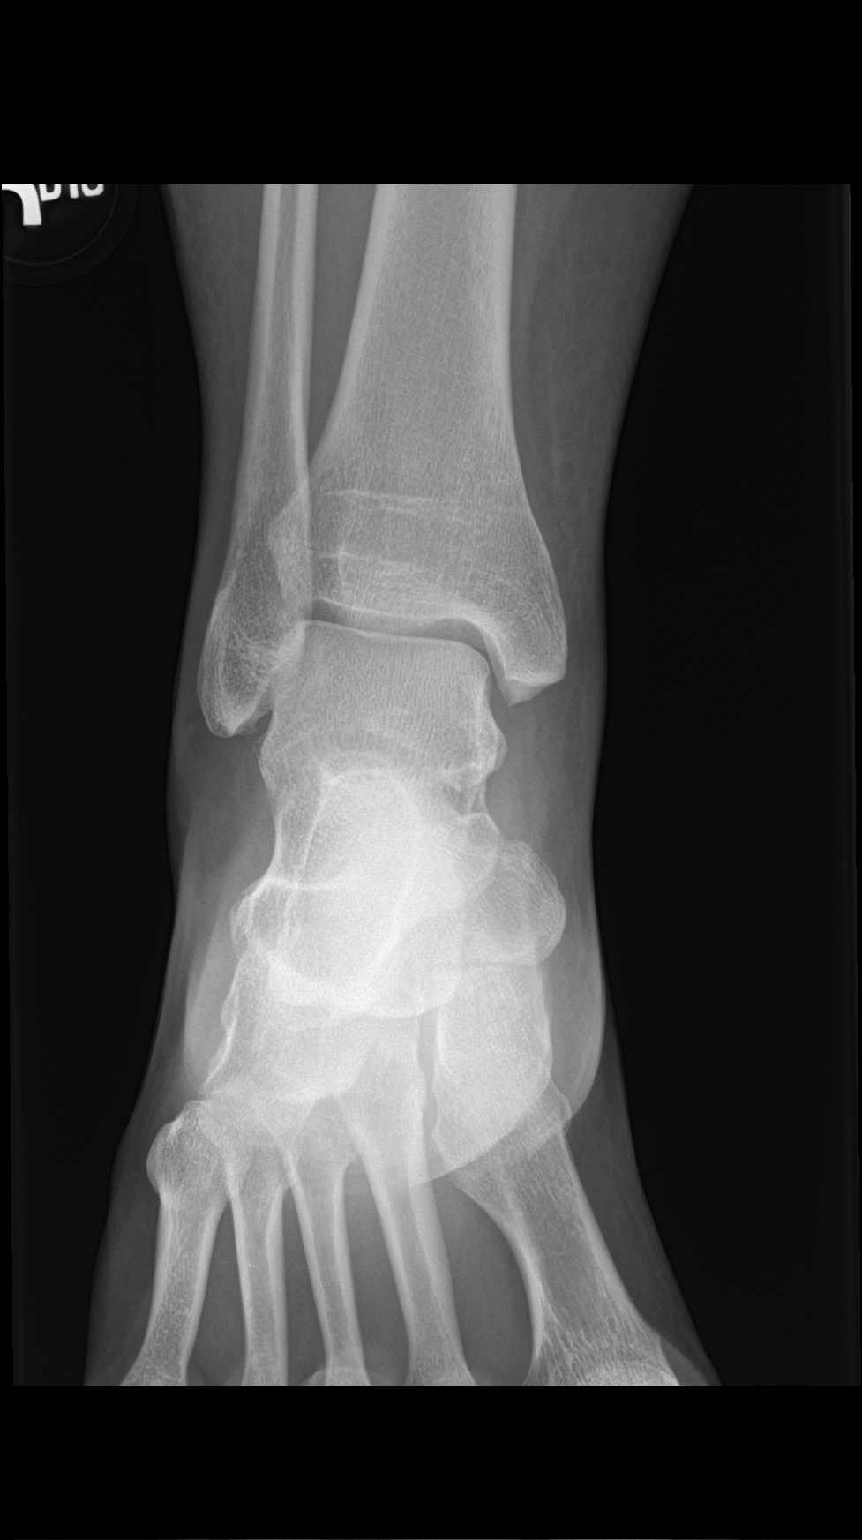

[ankle obl]
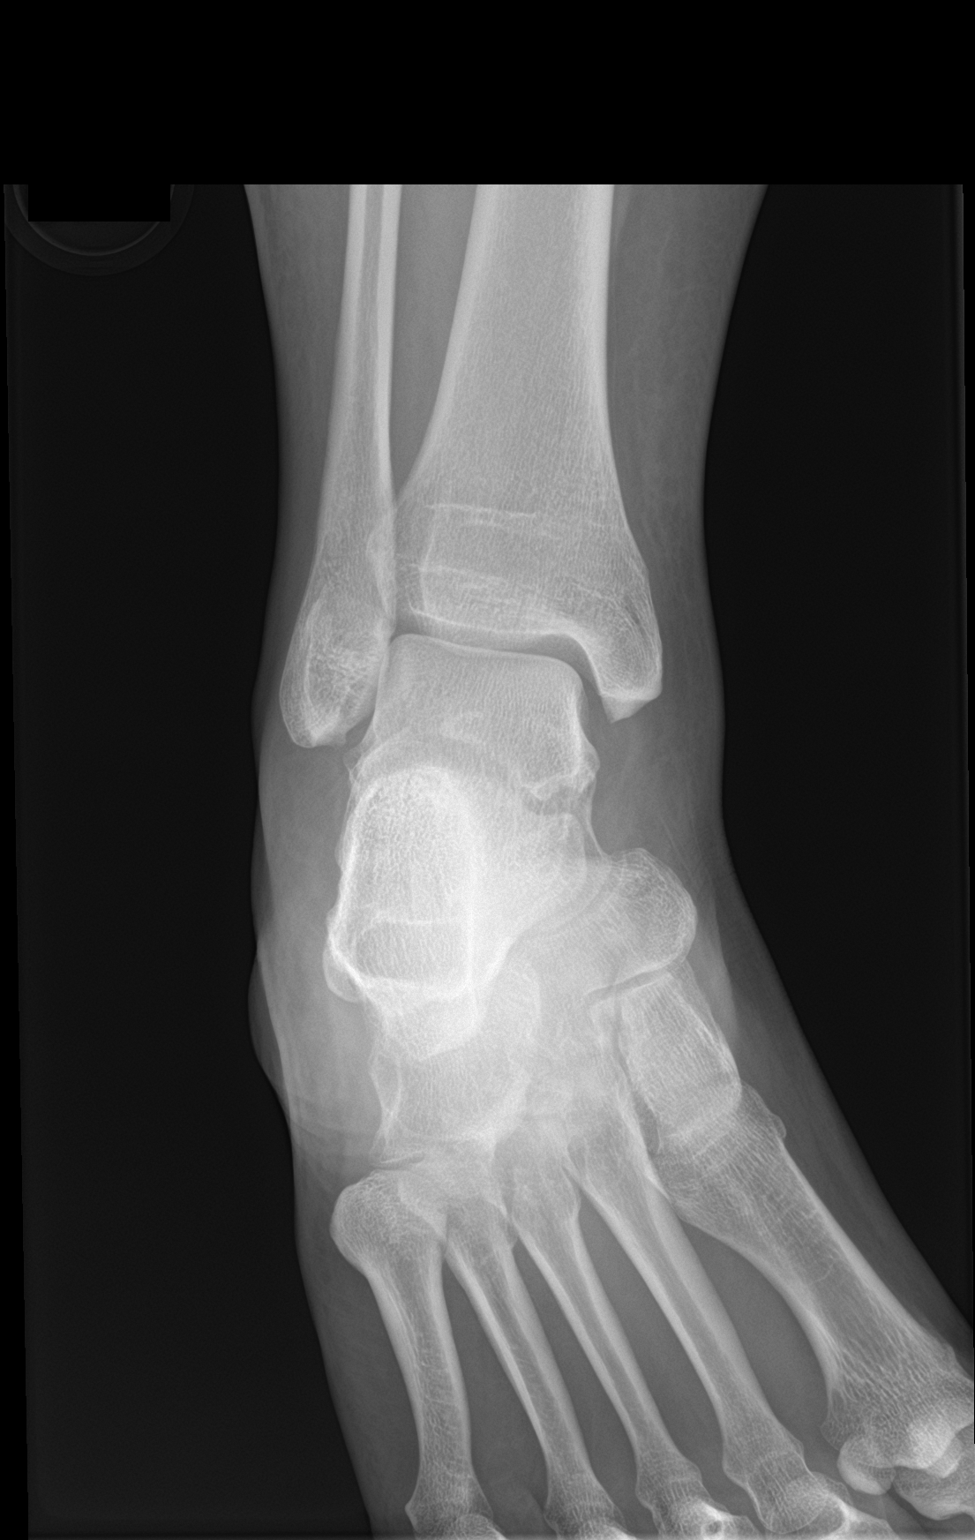

[ankle lat]
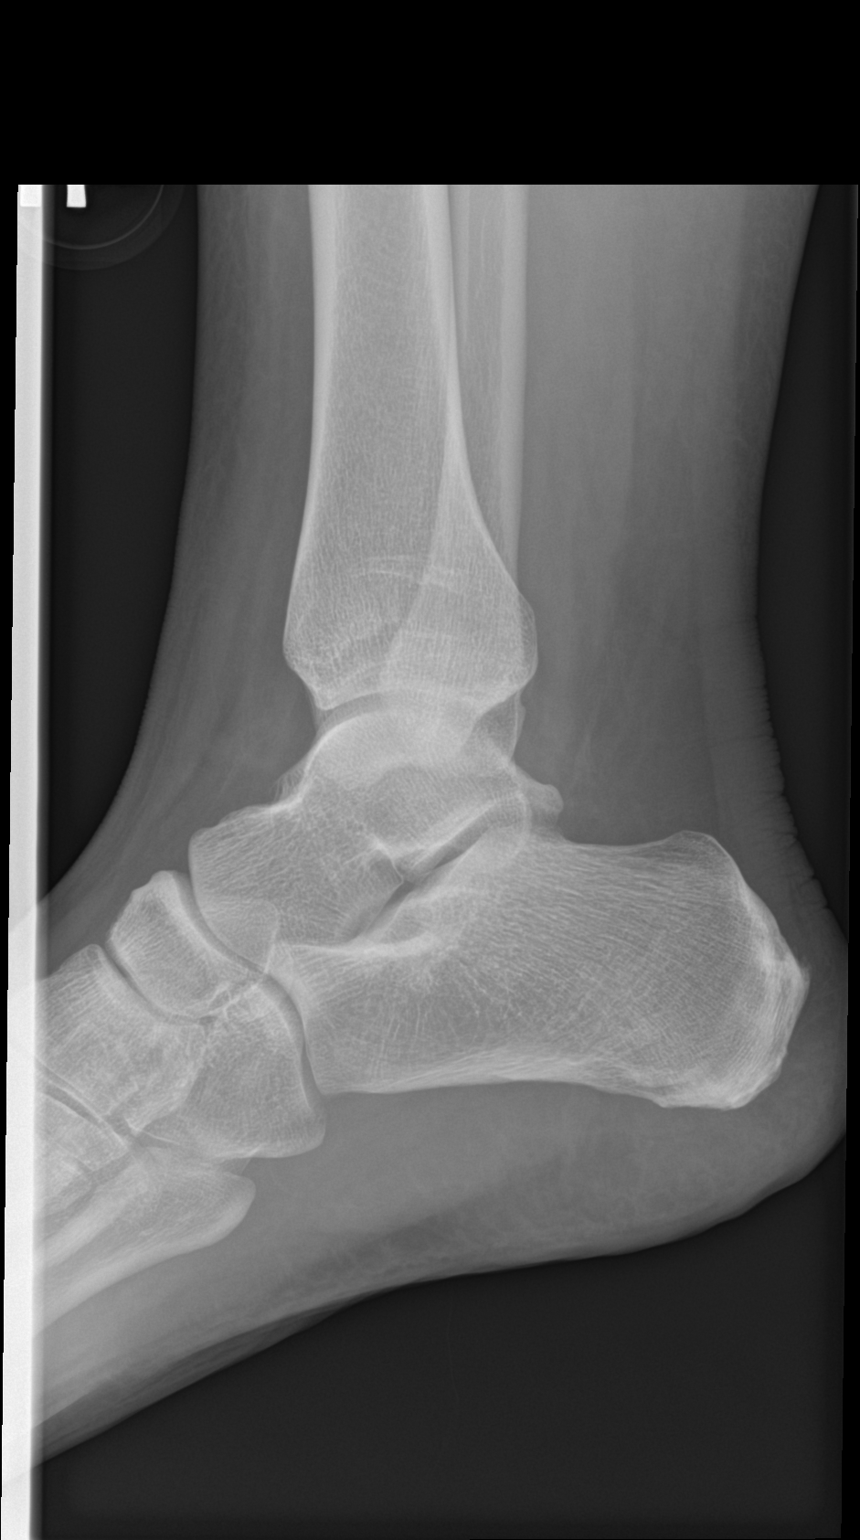

[3 of 3 positions shown; findings below may reference images not displayed]

FINDINGS: There is no evidence of fracture, dislocation, or joint effusion.
There is no evidence of arthropathy or other focal bone abnormality.
Mild sub cutaneous soft tissue edema of the right ankle.
IMPRESSION: Mild subcutaneus soft tissue edema of the right ankle with no
underlying acute displaced fracture or dislocation.

## 2021-06-02 ENCOUNTER — Ambulatory Visit: Payer: BC Managed Care – PPO | Admitting: Podiatry

## 2021-06-02 ENCOUNTER — Encounter: Payer: Self-pay | Admitting: Podiatry

## 2021-06-02 ENCOUNTER — Ambulatory Visit (INDEPENDENT_AMBULATORY_CARE_PROVIDER_SITE_OTHER): Payer: BC Managed Care – PPO

## 2021-06-02 ENCOUNTER — Other Ambulatory Visit: Payer: Self-pay

## 2021-06-02 DIAGNOSIS — Z8669 Personal history of other diseases of the nervous system and sense organs: Secondary | ICD-10-CM | POA: Diagnosis not present

## 2021-06-02 DIAGNOSIS — M779 Enthesopathy, unspecified: Secondary | ICD-10-CM

## 2021-06-05 NOTE — Progress Notes (Signed)
Subjective:   Patient ID: Rhonda Baker, female   DOB: 36 y.o.   MRN: [id number]   HPI Patient presents with a lot of numbness and burning tingling of both her feet and has seen numerous physicians and has not achieved resolution of her symptoms with her feet and hands seeming to be the most involved.  Patient has gained weight with these problems does not smoke and would like to be more active   Review of Systems  All other systems reviewed and are negative.      Objective:  Physical Exam Vitals and nursing note reviewed.  Constitutional:      Appearance: She is well-developed.  Pulmonary:     Effort: Pulmonary effort is normal.  Musculoskeletal:        General: Normal range of motion.  Skin:    General: Skin is warm.  Neurological:     Mental Status: She is alert.    Vascular status was found to be intact diminishment of sharp dull vibratory with patient noted to have numbness tingling of both feet and also into her hands at times.  She has seen a neurologist and is due to go to a major medical institution for consideration of some form of autoimmune pathology.  She has been tentatively diagnosed with psoriatic arthritis     Assessment:  Probability for some kind of autoimmune issue with the possibility that psoriatic is part of this pathology     Plan:  H&P x-rays reviewed condition discussed and I do not see currently what I can add to this but I strongly recommended her going to a major medical institution where there can be a multidisciplinary approach to this condition.  I am hopeful they will be able to make a difference for her or that this will burn itself out and I did review her x-rays with her today and we will see her as needed  X-rays did indicate the second digit right shows some distal lysis of the distal phalanx right second digit with no other pathology noted

## 2021-06-23 DIAGNOSIS — I10 Essential (primary) hypertension: Secondary | ICD-10-CM | POA: Diagnosis not present

## 2021-06-23 DIAGNOSIS — R7303 Prediabetes: Secondary | ICD-10-CM | POA: Diagnosis not present

## 2021-07-07 DIAGNOSIS — R21 Rash and other nonspecific skin eruption: Secondary | ICD-10-CM | POA: Diagnosis not present

## 2021-07-07 DIAGNOSIS — M2550 Pain in unspecified joint: Secondary | ICD-10-CM | POA: Diagnosis not present

## 2021-07-07 DIAGNOSIS — R600 Localized edema: Secondary | ICD-10-CM | POA: Diagnosis not present

## 2021-07-07 DIAGNOSIS — L4059 Other psoriatic arthropathy: Secondary | ICD-10-CM | POA: Diagnosis not present

## 2021-07-09 ENCOUNTER — Encounter: Payer: Self-pay | Admitting: Neurology

## 2021-07-22 DIAGNOSIS — J029 Acute pharyngitis, unspecified: Secondary | ICD-10-CM | POA: Diagnosis not present

## 2021-07-24 ENCOUNTER — Encounter: Payer: Self-pay | Admitting: Emergency Medicine

## 2021-07-24 ENCOUNTER — Other Ambulatory Visit: Payer: Self-pay

## 2021-07-24 ENCOUNTER — Ambulatory Visit
Admission: EM | Admit: 2021-07-24 | Discharge: 2021-07-24 | Disposition: A | Discharge status: Home / Self Care | Payer: BC Managed Care – PPO | Attending: Family Medicine | Admitting: Family Medicine

## 2021-07-24 DIAGNOSIS — R509 Fever, unspecified: Secondary | ICD-10-CM

## 2021-07-24 DIAGNOSIS — J029 Acute pharyngitis, unspecified: Secondary | ICD-10-CM

## 2021-07-24 LAB — POCT MONO SCREEN (KUC): Mono, POC: NEGATIVE

## 2021-07-24 MED ORDER — AMOXICILLIN-POT CLAVULANATE 875-125 MG PO TABS: 1.0000 | ORAL_TABLET | Freq: Two times a day (BID) | ORAL | 0 refills | Status: DC

## 2021-07-24 NOTE — Discharge Instructions (Signed)
You may use over the counter ibuprofen or acetaminophen as needed.  For a sore throat, over the counter products such as [employer] Peroxyl Mouth Sore Rinse or Chloraseptic Sore Throat Spray may provide some temporary relief. Your rapid strep test was negative today. We have sent your throat swab for culture and will let you know of any positive results. Stop taking the clindamycin. Begin: Meds ordered this encounter  Medications   amoxicillin-clavulanate (AUGMENTIN) 875-125 MG tablet    Sig: Take 1 tablet by mouth every 12 (twelve) hours.    Dispense:  20 tablet    Refill:  0

## 2021-07-24 NOTE — ED Provider Notes (Signed)
[facility]   [id number] 07/24/21 Arrival Time: 0175  ASSESSMENT & PLAN:  1. Sore throat   2. Fever, unspecified fever cause    No signs of peritonsillar abscess. Discussed. Based on history/exam will have her stop clindamycin and begin: Meds ordered this encounter  Medications   amoxicillin-clavulanate (AUGMENTIN) 875-125 MG tablet    Sig: Take 1 tablet by mouth every 12 (twelve) hours.    Dispense:  20 tablet    Refill:  0   Results for orders placed or performed during the hospital encounter of 07/24/21  POCT mono screen  Result Value Ref Range   Mono, POC Negative Negative   Labs Reviewed  POCT MONO SCREEN ([facility])   OTC analgesics and throat care as needed  Instructed to finish full 10 day course of antibiotics. Will follow up if not showing significant improvement over the next 24-48 hours.    Discharge Instructions      You may use over the counter ibuprofen or acetaminophen as needed.  For a sore throat, over the counter products such as [employer] Peroxyl Mouth Sore Rinse or Chloraseptic Sore Throat Spray may provide some temporary relief. Your rapid strep test was negative today. We have sent your throat swab for culture and will let you know of any positive results. Stop taking the clindamycin. Begin: Meds ordered this encounter  Medications   amoxicillin-clavulanate (AUGMENTIN) 875-125 MG tablet    Sig: Take 1 tablet by mouth every 12 (twelve) hours.    Dispense:  20 tablet    Refill:  0         Reviewed expectations re: course of current medical issues. Questions answered. Outlined signs and symptoms indicating need for more acute intervention. Patient verbalized understanding. After Visit Summary given.   SUBJECTIVE:  Rhonda Baker is a 36 y.o. female who reports a sore throat. Describes as sharp and painful swallowing. Onset abrupt beginning  2-3 d ago. Televisit and Rx clindamycin; no significant relief . Symptoms have gradually  worsened since beginning; without voice changes. No respiratory symptoms. Normal PO intake but reports discomfort with swallowing. No specific alleviating factors. Fever: unsure. No neck pain or swelling. No associated nausea, vomiting, or abdominal pain. Known sick contacts: none. Recent travel: none.   OBJECTIVE:  Vitals:   07/24/21 0849  BP: 104/71  Pulse: (!) 103  Resp: 16  Temp: 98.4 F (36.9 C)  TempSrc: Oral  SpO2: 98%    Slight tachycardia noted.  General appearance: alert; no distress HEENT: throat with moderate erythema with bilat exudative tonsil hypertrophy; uvula is midline Neck: supple with FROM; mod bilat cerv LAD that is TTP Lungs: speaks full sentences without difficulty; unlabored Abd: soft; non-tender Skin: reveals no rash; warm and dry Psychological: alert and cooperative; normal mood and affect  Allergies  Allergen Reactions   Percocet [Oxycodone-Acetaminophen]     hives    Past Medical History:  Diagnosis Date   Allergies    Anxiety    [employer]reesia 11/25/2019   Depression    [employer]reesia 11/25/2019   Psoriatic arthritis (HCC)    RA (rheumatoid arthritis) (HCC)    SERONEGATIVE   Social History   Socioeconomic History   Marital status: Divorced    Spouse name: Not on file   Number of children: 1   Years of education: 4   Highest education level: Not on file  Occupational History    Comment: [employer]   Tobacco Use   Smoking status: Never  Smokeless tobacco: Never  Vaping Use   Vaping Use: Never used  Substance and Sexual Activity   Alcohol use: Yes    Alcohol/week: 3.0 - 4.0 standard drinks    Types: 3 - 4 Glasses of wine per week   Drug use: Never   Sexual activity: Not on file  Other Topics Concern   Not on file  Social History Narrative   Lives with son [name]-Primary Custody   [name]      Enjoy: hanging out with son, friends, going       Diet: eats all food groups    Caffeine: 1 cup of coffee a day    Water: 6-8 cups daily       Wears seat belt    Does not use phone while driving    Psychologist, sport and exercise at home   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

## 2021-07-24 NOTE — ED Triage Notes (Addendum)
Sore throat with edematous erythematous tonsils, white exudate, fever starting Friday. Teledoc visit Saturday, got a clindamycin prescription, has been taking it without any improvement since.  Took 600 mg ibuprofen with a 500 mg tylenol at 4 am today. On methotrexate for psoriatic arthritis, last took thursday

## 2021-09-05 DIAGNOSIS — Z124 Encounter for screening for malignant neoplasm of cervix: Secondary | ICD-10-CM | POA: Diagnosis not present

## 2021-09-05 DIAGNOSIS — L918 Other hypertrophic disorders of the skin: Secondary | ICD-10-CM | POA: Diagnosis not present

## 2021-09-05 DIAGNOSIS — Z01419 Encounter for gynecological examination (general) (routine) without abnormal findings: Secondary | ICD-10-CM | POA: Diagnosis not present

## 2021-09-05 DIAGNOSIS — Z6841 Body Mass Index (BMI) 40.0 and over, adult: Secondary | ICD-10-CM | POA: Diagnosis not present

## 2021-09-05 LAB — RESULTS CONSOLE HPV: CHL HPV: NEGATIVE

## 2021-09-11 LAB — HM PAP SMEAR: HM Pap smear: NORMAL

## 2021-09-22 DIAGNOSIS — J3089 Other allergic rhinitis: Secondary | ICD-10-CM | POA: Diagnosis not present

## 2021-09-22 DIAGNOSIS — J301 Allergic rhinitis due to pollen: Secondary | ICD-10-CM | POA: Diagnosis not present

## 2021-09-22 DIAGNOSIS — H1045 Other chronic allergic conjunctivitis: Secondary | ICD-10-CM | POA: Diagnosis not present

## 2021-09-22 DIAGNOSIS — T783XXD Angioneurotic edema, subsequent encounter: Secondary | ICD-10-CM | POA: Diagnosis not present

## 2021-09-29 DIAGNOSIS — G629 Polyneuropathy, unspecified: Secondary | ICD-10-CM | POA: Diagnosis not present

## 2021-09-29 DIAGNOSIS — L4059 Other psoriatic arthropathy: Secondary | ICD-10-CM | POA: Diagnosis not present

## 2021-09-29 DIAGNOSIS — R600 Localized edema: Secondary | ICD-10-CM | POA: Diagnosis not present

## 2021-09-29 DIAGNOSIS — R21 Rash and other nonspecific skin eruption: Secondary | ICD-10-CM | POA: Diagnosis not present

## 2021-11-06 DIAGNOSIS — F3342 Major depressive disorder, recurrent, in full remission: Secondary | ICD-10-CM | POA: Diagnosis not present

## 2021-11-06 DIAGNOSIS — F900 Attention-deficit hyperactivity disorder, predominantly inattentive type: Secondary | ICD-10-CM | POA: Diagnosis not present

## 2021-11-06 DIAGNOSIS — Z79899 Other long term (current) drug therapy: Secondary | ICD-10-CM | POA: Diagnosis not present

## 2021-11-06 DIAGNOSIS — G8929 Other chronic pain: Secondary | ICD-10-CM | POA: Diagnosis not present

## 2021-11-18 DIAGNOSIS — Z20822 Contact with and (suspected) exposure to covid-19: Secondary | ICD-10-CM | POA: Diagnosis not present

## 2021-11-18 DIAGNOSIS — J019 Acute sinusitis, unspecified: Secondary | ICD-10-CM | POA: Diagnosis not present

## 2021-11-18 DIAGNOSIS — J029 Acute pharyngitis, unspecified: Secondary | ICD-10-CM | POA: Diagnosis not present

## 2021-12-22 DIAGNOSIS — I10 Essential (primary) hypertension: Secondary | ICD-10-CM | POA: Diagnosis not present

## 2021-12-22 DIAGNOSIS — R7303 Prediabetes: Secondary | ICD-10-CM | POA: Diagnosis not present

## 2021-12-22 DIAGNOSIS — Z1339 Encounter for screening examination for other mental health and behavioral disorders: Secondary | ICD-10-CM | POA: Diagnosis not present

## 2022-01-12 DIAGNOSIS — R7302 Impaired glucose tolerance (oral): Secondary | ICD-10-CM | POA: Diagnosis not present

## 2022-01-12 DIAGNOSIS — I10 Essential (primary) hypertension: Secondary | ICD-10-CM | POA: Diagnosis not present

## 2022-01-19 DIAGNOSIS — G629 Polyneuropathy, unspecified: Secondary | ICD-10-CM | POA: Diagnosis not present

## 2022-01-19 DIAGNOSIS — R600 Localized edema: Secondary | ICD-10-CM | POA: Diagnosis not present

## 2022-01-19 DIAGNOSIS — R21 Rash and other nonspecific skin eruption: Secondary | ICD-10-CM | POA: Diagnosis not present

## 2022-01-19 DIAGNOSIS — L4059 Other psoriatic arthropathy: Secondary | ICD-10-CM | POA: Diagnosis not present

## 2022-01-19 DIAGNOSIS — Z111 Encounter for screening for respiratory tuberculosis: Secondary | ICD-10-CM | POA: Diagnosis not present

## 2022-04-13 DIAGNOSIS — R21 Rash and other nonspecific skin eruption: Secondary | ICD-10-CM | POA: Diagnosis not present

## 2022-04-13 DIAGNOSIS — G629 Polyneuropathy, unspecified: Secondary | ICD-10-CM | POA: Diagnosis not present

## 2022-04-13 DIAGNOSIS — R600 Localized edema: Secondary | ICD-10-CM | POA: Diagnosis not present

## 2022-04-13 DIAGNOSIS — L4059 Other psoriatic arthropathy: Secondary | ICD-10-CM | POA: Diagnosis not present

## 2022-05-14 DIAGNOSIS — F900 Attention-deficit hyperactivity disorder, predominantly inattentive type: Secondary | ICD-10-CM | POA: Diagnosis not present

## 2022-05-14 DIAGNOSIS — F3342 Major depressive disorder, recurrent, in full remission: Secondary | ICD-10-CM | POA: Diagnosis not present

## 2022-05-14 DIAGNOSIS — G8929 Other chronic pain: Secondary | ICD-10-CM | POA: Diagnosis not present

## 2022-05-17 DIAGNOSIS — R5383 Other fatigue: Secondary | ICD-10-CM | POA: Diagnosis not present

## 2022-05-17 DIAGNOSIS — M0609 Rheumatoid arthritis without rheumatoid factor, multiple sites: Secondary | ICD-10-CM | POA: Diagnosis not present

## 2022-05-23 ENCOUNTER — Encounter: Payer: Self-pay | Admitting: Nurse Practitioner

## 2022-05-23 ENCOUNTER — Other Ambulatory Visit (HOSPITAL_COMMUNITY): Payer: Self-pay

## 2022-05-23 ENCOUNTER — Telehealth: Payer: Self-pay

## 2022-05-23 ENCOUNTER — Other Ambulatory Visit (HOSPITAL_COMMUNITY): Payer: Self-pay | Admitting: General Surgery

## 2022-05-23 ENCOUNTER — Ambulatory Visit (INDEPENDENT_AMBULATORY_CARE_PROVIDER_SITE_OTHER): Payer: BC Managed Care – PPO | Admitting: Nurse Practitioner

## 2022-05-23 VITALS — BP 124/82 | HR 92 | Temp 95.9°F | Ht 65.0 in | Wt 269.6 lb

## 2022-05-23 DIAGNOSIS — R7303 Prediabetes: Secondary | ICD-10-CM | POA: Diagnosis not present

## 2022-05-23 DIAGNOSIS — F32A Depression, unspecified: Secondary | ICD-10-CM | POA: Diagnosis not present

## 2022-05-23 DIAGNOSIS — M0600 Rheumatoid arthritis without rheumatoid factor, unspecified site: Secondary | ICD-10-CM | POA: Diagnosis not present

## 2022-05-23 DIAGNOSIS — L4050 Arthropathic psoriasis, unspecified: Secondary | ICD-10-CM

## 2022-05-23 DIAGNOSIS — F419 Anxiety disorder, unspecified: Secondary | ICD-10-CM | POA: Diagnosis not present

## 2022-05-23 DIAGNOSIS — E559 Vitamin D deficiency, unspecified: Secondary | ICD-10-CM | POA: Diagnosis not present

## 2022-05-23 DIAGNOSIS — K219 Gastro-esophageal reflux disease without esophagitis: Secondary | ICD-10-CM

## 2022-05-23 DIAGNOSIS — I10 Essential (primary) hypertension: Secondary | ICD-10-CM

## 2022-05-23 LAB — BASIC METABOLIC PANEL
BUN: 8 mg/dL (ref 6–23)
CO2: 26 mEq/L (ref 19–32)
Calcium: 9.5 mg/dL (ref 8.4–10.5)
Chloride: 101 mEq/L (ref 96–112)
Creatinine, Ser: 0.66 mg/dL (ref 0.40–1.20)
GFR: 111.79 mL/min (ref >60.00)
Glucose, Bld: 104 mg/dL — ABNORMAL HIGH (ref 70–99)
Potassium: 4.7 mEq/L (ref 3.5–5.1)
Sodium: 135 mEq/L (ref 135–145)

## 2022-05-23 LAB — LIPID PANEL
Cholesterol: 227 mg/dL — ABNORMAL HIGH (ref 0–200)
HDL: 46.1 mg/dL (ref >39.00)
LDL Cholesterol: 151 mg/dL — ABNORMAL HIGH (ref 0–99)
NonHDL: 180.9
Total CHOL/HDL Ratio: 5
Triglycerides: 150 mg/dL — ABNORMAL HIGH (ref 0.0–149.0)
VLDL: 30 mg/dL (ref 0.0–40.0)

## 2022-05-23 LAB — CBC
HCT: 40.5 % (ref 36.0–46.0)
Hemoglobin: 13.2 g/dL (ref 12.0–15.0)
MCHC: 32.6 g/dL (ref 30.0–36.0)
MCV: 98.4 fl (ref 78.0–100.0)
Platelets: 513 10*3/uL — ABNORMAL HIGH (ref 150.0–400.0)
RBC: 4.12 Mil/uL (ref 3.87–5.11)
RDW: 14.2 % (ref 11.5–15.5)
WBC: 9.4 10*3/uL (ref 4.0–10.5)

## 2022-05-23 LAB — TSH: TSH: 1.38 u[IU]/mL (ref 0.35–5.50)

## 2022-05-23 LAB — FOLATE: Folate: 17.3 ng/mL (ref >5.9)

## 2022-05-23 LAB — VITAMIN D 25 HYDROXY (VIT D DEFICIENCY, FRACTURES): VITD: 20.41 ng/mL — ABNORMAL LOW (ref 30.00–100.00)

## 2022-05-23 LAB — PROTIME-INR
INR: 0.9 ratio (ref 0.8–1.0)
Prothrombin Time: 10.1 s (ref 9.6–13.1)

## 2022-05-23 LAB — VITAMIN B12: Vitamin B-12: 666 pg/mL (ref 211–911)

## 2022-05-23 LAB — HEMOGLOBIN A1C: Hgb A1c MFr Bld: 6.5 % (ref 4.6–6.5)

## 2022-05-23 MED ORDER — PANTOPRAZOLE SODIUM 40 MG PO TBEC: 40.0000 mg | DELAYED_RELEASE_TABLET | Freq: Every day | ORAL | 3 refills | Status: DC

## 2022-05-23 NOTE — Assessment & Plan Note (Signed)
BMI is 44.8.  She has met with Dr. Alvino Blood at [facility] surgery and is planning for gastric sleeve operation.  She needs labs completed before this surgery.  Will check BMP, CBC, PT/INR, iron panel, folate, TSH, free t4, vitamin D, and vitamin B12.

## 2022-05-23 NOTE — Assessment & Plan Note (Signed)
Chronic, stable.  She is currently following with psychiatry.  Will have her continue her current regimen in collaboration with specialist.

## 2022-05-23 NOTE — Assessment & Plan Note (Signed)
Chronic, stable.  She currently follows with rheumatology and is taking methotrexate 2.5 mg once a week and Cosentyx 300 mg monthly.  Continue collaboration recommendation from rheumatology.

## 2022-05-23 NOTE — Progress Notes (Signed)
New Patient Visit  BP 124/82 (BP Location: Right Arm, Patient Position: Sitting, Cuff Size: Normal)   Pulse 92   Temp (!) 95.9 F (35.5 C) (Temporal)   Ht _0  (1.651 m)   Wt 269 lb 9.6 oz (122.3 kg)   SpO2 95%   BMI 44.86 kg/m    Subjective:    Patient ID: Rhonda Baker, female    DOB: 03/08/1986, 36 y.o.   MRN: [id number]  CC: Chief Complaint  Patient presents with   Establish Care    New pt, est care  Psoriatic Arthritis discussion Weight loss options   Requesting records for pap    HPI Rhonda Baker is a 36 y.o. female presents for new patient visit to establish care.  Introduced to Designer, jewellery role and practice setting.  All questions answered.  Discussed provider/patient relationship and expectations.  She has a history of psoriatic arthritis and rheumatoid arthritis and is currently following with rheumatology.  She is currently taking methotrexate 2.5 mg once a week and Cosentyx 300 mg once a month.  She states that overall her symptoms are well-controlled.  She is currently having some pain in her right ankle, however this tends to fluctuate locations.  She has a history of anxiety, depression, and ADHD and is currently following with psychiatry.  She is currently taking Adderall 30 mg daily, armodafinil 250 mg daily, Klonopin 1 to 2 mg as needed at bedtime, doxepin 75 mg daily, and Lexapro 20 mg daily.  She states that her symptoms are overall well-controlled.  She has a history of obesity and this is currently the heaviest that she has ever been.  She was on prednisone for numerous years until her rheumatoid arthritis was under control.  She is currently following with Dr. Alvino Blood at [facility] surgery and is planning on having a gastric sleeve.  She has labs that need to be completed before her surgery and is asking if she can get them done here today.  She also notes an increase in acid reflux over the past few weeks.  She states that she has been  waking up at night and the other day during a nap with a burning sensation in the back of her throat.  She tried over-the-counter Prilosec and this did not help her symptoms.     05/23/2022   11:35 AM 04/20/2020   10:29 AM 03/15/2020    1:21 PM 01/01/2020   10:48 AM 12/16/2019    4:26 PM  Depression screen PHQ 2/9  Decreased Interest 0 0 0 0 2  Down, Depressed, Hopeless 1 0 0 0 0  PHQ - 2 Score 1 0 0 0 2  Altered sleeping 0    0  Tired, decreased energy 0    3  Change in appetite 0    0  Feeling bad or failure about yourself  1    0  Trouble concentrating 0    0  Moving slowly or fidgety/restless 0    0  Suicidal thoughts 0    0  PHQ-9 Score 2    5  Difficult doing work/chores Somewhat difficult   Not difficult at all Somewhat difficult      05/23/2022   11:35 AM 03/15/2020    1:21 PM 12/16/2019    4:27 PM 11/27/2019    8:06 AM  GAD 7 : Generalized Anxiety Score  Nervous, Anxious, on Edge 0 0 3 3  Control/stop worrying 0 0 0 3  Worry too much - different things 1 0 0 3  Trouble relaxing 1 0 3 3  Restless 0 0 0 0  Easily annoyed or irritable 0 0 0 3  Afraid - awful might happen 0 0 0 0  Total GAD 7 Score 2 0 6 15  Anxiety Difficulty Somewhat difficult Not difficult at all Not difficult at all Somewhat difficult    Past Medical History:  Diagnosis Date   Allergies    Allergy 2002   Anxiety    Phreesia 11/25/2019   Depression    Phreesia 11/25/2019   GERD (gastroesophageal reflux disease) Last year   Lately, I have been waking up choking on acid   Prediabetes    Psoriatic arthritis ([place])    RA (rheumatoid arthritis) ([place])    SERONEGATIVE    Past Surgical History:  Procedure Laterality Date   CESAREAN SECTION     2011   COLPOSCOPY     COSMETIC SURGERY  2017   Breast lift   LAPAROSCOPY  2008   GYN   LEEP      Family History  Problem Relation Age of Onset   Glaucoma Mother    Anxiety disorder Mother    Depression Mother    Diabetes Father     Hyperlipidemia Father    Hearing loss Father    Hypertension Father    Healthy Son    Lupus Cousin    Celiac disease Cousin    Neuropathy Maternal Grandfather    Cancer Maternal Grandfather    Diabetes Maternal Grandfather    Multiple sclerosis Cousin        paternal 1st cousin   Anxiety disorder Maternal Grandmother    Arthritis Maternal Grandmother    Depression Maternal Grandmother    Alcohol abuse Paternal Grandfather    Cancer Paternal Grandfather    Cancer Paternal Grandmother    Cancer Paternal Aunt    Cancer Paternal Uncle      Social History   Tobacco Use   Smoking status: Never   Smokeless tobacco: Never  Vaping Use   Vaping Use: Never used  Substance Use Topics   Alcohol use: Yes    Alcohol/week: 3.0 - 4.0 standard drinks of alcohol    Types: 3 - 4 Glasses of wine per week   Drug use: Never    Current Outpatient Medications on File Prior to Visit  Medication Sig Dispense Refill   amphetamine-dextroamphetamine (ADDERALL) 30 MG tablet Take 30 mg by mouth 2 (two) times daily.     Armodafinil 250 MG tablet Take 250 mg by mouth every morning.     azelastine (OPTIVAR) 0.05 % ophthalmic solution Apply to eye.     clonazePAM (KLONOPIN) 1 MG tablet Take 1-2 mg by mouth at bedtime.      COSENTYX SENSOREADY, 300 MG, 150 MG/ML SOAJ Inject 2 Syringes into the skin every 28 (twenty-eight) days.     doxepin (SINEQUAN) 75 MG capsule Take 150 mg by mouth at bedtime.     escitalopram (LEXAPRO) 20 MG tablet Take 1 tablet (20 mg total) by mouth daily. 90 tablet 0   folic acid (FOLVITE) 1 MG tablet 1 tablet Orally Once a day for 90 days     gabapentin (NEURONTIN) 300 MG capsule Take 300 mg by mouth 3 (three) times daily.     JUNEL FE 1/20 1-20 MG-MCG tablet Take 1 tablet by mouth daily.     Magnesium Citrate 200 MG TABS      methotrexate (  RHEUMATREX) 2.5 MG tablet 6 tablets     ondansetron (ZOFRAN) 4 MG tablet Take 4 mg by mouth as needed for nausea or vomiting.     No  current facility-administered medications on file prior to visit.     Review of Systems  Constitutional:  Positive for fatigue. Negative for fever.  HENT: Negative.    Eyes: Negative.   Respiratory: Negative.    Cardiovascular: Negative.   Gastrointestinal: Negative.   Genitourinary: Negative.   Musculoskeletal:  Positive for arthralgias (right ankle).  Skin: Negative.   Neurological: Negative.   Psychiatric/Behavioral: Negative.        Objective:    BP 124/82 (BP Location: Right Arm, Patient Position: Sitting, Cuff Size: Normal)   Pulse 92   Temp (!) 95.9 F (35.5 C) (Temporal)   Ht _0  (1.651 m)   Wt 269 lb 9.6 oz (122.3 kg)   SpO2 95%   BMI 44.86 kg/m   Wt Readings from Last 3 Encounters:  05/23/22 269 lb 9.6 oz (122.3 kg)  09/05/20 241 lb (109.3 kg)  03/30/20 223 lb (101.2 kg)    BP Readings from Last 3 Encounters:  05/23/22 124/82  07/24/21 104/71  11/30/20 123/81    Physical Exam Vitals and nursing note reviewed.  Constitutional:      General: She is not in acute distress.    Appearance: Normal appearance.  HENT:     Head: Normocephalic and atraumatic.     Right Ear: Tympanic membrane, ear canal and external ear normal.     Left Ear: Tympanic membrane, ear canal and external ear normal.  Eyes:     Conjunctiva/sclera: Conjunctivae normal.  Cardiovascular:     Rate and Rhythm: Normal rate and regular rhythm.     Pulses: Normal pulses.     Heart sounds: Normal heart sounds.  Pulmonary:     Effort: Pulmonary effort is normal.     Breath sounds: Normal breath sounds.  Abdominal:     Palpations: Abdomen is soft.     Tenderness: There is no abdominal tenderness.  Musculoskeletal:        General: Normal range of motion.     Cervical back: Normal range of motion and neck supple.     Right lower leg: No edema.     Left lower leg: No edema.  Lymphadenopathy:     Cervical: No cervical adenopathy.  Skin:    General: Skin is warm and dry.  Neurological:      General: No focal deficit present.     Mental Status: She is alert and oriented to person, place, and time.     Cranial Nerves: No cranial nerve deficit.     Coordination: Coordination normal.     Gait: Gait normal.  Psychiatric:        Mood and Affect: Mood normal.        Behavior: Behavior normal.        Thought Content: Thought content normal.        Judgment: Judgment normal.        Assessment & Plan:   Problem List Items Addressed This Visit       Cardiovascular and Mediastinum   Essential hypertension    Chronic, stable.  It is currently controlled without medication.  She was on hydrochlorothiazide in the past, however was able to come off of this.  BP today 124/82.  Check BMP, CBC, lipid panel today.      Relevant Orders   Basic metabolic  panel   CBC   Lipid panel     Digestive   Gastroesophageal reflux disease - Primary    She has been experiencing acid reflux, especially at night for the last few weeks.  Will have her start pantoprazole 40 mg daily since Prilosec did not help her symptoms.  Information given on eating smaller meals more frequently and not laying down right after eating.  Will check h. Pylori breath test. Follow-up in 3 to 4 weeks.      Relevant Medications   pantoprazole (PROTONIX) 40 MG tablet   Other Relevant Orders   H. pylori breath test     Musculoskeletal and Integument   Psoriatic arthritis (HCC)    Chronic, stable.  She currently follows with rheumatology and is taking methotrexate 2.5 mg once a week and Cosentyx 300 mg monthly.  Continue collaboration recommendation from rheumatology.      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  She currently follows with rheumatology and is taking methotrexate 2.5 mg once a week and Cosentyx 300 mg monthly.  Continue collaboration recommendation from rheumatology.      Relevant Orders   Basic metabolic  panel   CBC     Other   Vitamin D deficiency    Will check vitamin D levels and adjust regimen based on results.      Relevant Orders   VITAMIN D 25 Hydroxy (Vit-D Deficiency, Fractures)   Anxiety and depression    Chronic, stable.  She is currently following with psychiatry.  Will have her continue her current regimen in collaboration with specialist.      Relevant Orders   TSH   Free Thyroxine + T4   Morbid obesity ([place])    BMI is 44.8.  She has met with Dr. Alvino Blood at [facility] surgery and is planning for gastric sleeve operation.  She needs labs completed before this surgery.  Will check BMP, CBC, PT/INR, iron panel, folate, TSH, free t4, vitamin D, and vitamin B12.      Relevant Medications   Armodafinil 250 MG tablet   Other Relevant Orders   Basic metabolic panel   CBC   Hemoglobin A1c   Iron, TIBC and Ferritin Panel   Vitamin B12   Folate   Protime-INR   Other Visit Diagnoses     Prediabetes       Relevant Orders   Basic metabolic panel   CBC   Hemoglobin A1c        Follow up plan: Return in about 3 months (around 08/22/2022) for gerd.

## 2022-05-23 NOTE — Assessment & Plan Note (Signed)
Will check vitamin D levels and adjust regimen based on results.  

## 2022-05-23 NOTE — Assessment & Plan Note (Addendum)
Chronic, stable.  It is currently controlled without medication.  She was on hydrochlorothiazide in the past, however was able to come off of this.  BP today 124/82.  Check BMP, CBC, lipid panel today.

## 2022-05-23 NOTE — Assessment & Plan Note (Signed)
Chronic, stable.  She currently follows with rheumatology and is taking methotrexate 2.5 mg once a week and Cosentyx 300 mg monthly.  Continue collaboration recommendation from rheumatology. 

## 2022-05-23 NOTE — Telephone Encounter (Signed)
Pharmacy Patient Advocate Encounter   Received notification from [facility] that prior authorization for Pantoprazole 40mg  tabs is required/requested.  Per Test Claim: drug requires a prior auth   PA submitted on 05/23/22 to (ins) 05/25/22  Commercial via [employer]  Status is pending

## 2022-05-23 NOTE — Assessment & Plan Note (Addendum)
She has been experiencing acid reflux, especially at night for the last few weeks.  Will have her start pantoprazole 40 mg daily since Prilosec did not help her symptoms.  Information given on eating smaller meals more frequently and not laying down right after eating.  Will check h. Pylori breath test. Follow-up in 3 to 4 weeks.

## 2022-05-23 NOTE — Patient Instructions (Signed)
It was great to see you!  Start protonix 1 tablet daily to help with reflux and burning sensation when sleeping.  We are checking your labs today and will let you know the results via mychart/phone.   Let's follow-up in 3 months, sooner if you have concerns.  If a referral was placed today, you will be contacted for an appointment. Please note that routine referrals can sometimes take up to 3-4 weeks to process. Please call our office if you haven't heard anything after this time frame.  Take care,  Rodman Pickle, NP

## 2022-05-24 ENCOUNTER — Encounter: Payer: Self-pay | Admitting: Nurse Practitioner

## 2022-05-24 ENCOUNTER — Other Ambulatory Visit: Payer: Self-pay | Admitting: Nurse Practitioner

## 2022-05-24 LAB — IRON,TIBC AND FERRITIN PANEL
%SAT: 18 % (calc) (ref 16–45)
Ferritin: 57 ng/mL (ref 16–154)
Iron: 75 ug/dL (ref 40–190)
TIBC: 414 mcg/dL (calc) (ref 250–450)

## 2022-05-24 LAB — FREE THYROXINE + T4
Free T4: 0.76 ng/dL — ABNORMAL LOW (ref 0.82–1.77)
T4, Total: 6.8 ug/dL (ref 4.5–12.0)

## 2022-05-24 LAB — H. PYLORI BREATH TEST: H. pylori Breath Test: NOT DETECTED

## 2022-05-24 MED ORDER — VITAMIN D (ERGOCALCIFEROL) 1.25 MG (50000 UNIT) PO CAPS: 50000.0000 [IU] | ORAL_CAPSULE | ORAL | 0 refills | Status: DC

## 2022-05-24 NOTE — Addendum Note (Signed)
Addended by: Rodman Pickle A on: 05/24/2022 08:19 AM   Modules accepted: Orders

## 2022-05-30 DIAGNOSIS — F5089 Other specified eating disorder: Secondary | ICD-10-CM | POA: Diagnosis not present

## 2022-05-31 NOTE — Telephone Encounter (Signed)
Prior authorization has been denied in previous encounter. Please advise.

## 2022-05-31 NOTE — Telephone Encounter (Signed)
Pharmacy Patient Advocate Encounter  Received notification from [facility] that the request for prior authorization for Pantoprazole has been denied due to .    How would you like to proceed?  Please be advised appeals may take up to 5 business days to be submitted as pharmacist prepares necessary documentation.  Thank you!

## 2022-06-01 ENCOUNTER — Ambulatory Visit (HOSPITAL_COMMUNITY)
Admission: RE | Admit: 2022-06-01 | Discharge: 2022-06-01 | Disposition: A | Discharge status: Home / Self Care | Payer: BC Managed Care – PPO | Source: Ambulatory Visit | Attending: General Surgery | Admitting: General Surgery

## 2022-06-01 ENCOUNTER — Other Ambulatory Visit: Payer: Self-pay

## 2022-06-01 DIAGNOSIS — Z9884 Bariatric surgery status: Secondary | ICD-10-CM | POA: Diagnosis not present

## 2022-06-01 DIAGNOSIS — K219 Gastro-esophageal reflux disease without esophagitis: Secondary | ICD-10-CM | POA: Diagnosis not present

## 2022-06-04 ENCOUNTER — Other Ambulatory Visit (HOSPITAL_COMMUNITY): Payer: Self-pay

## 2022-06-04 NOTE — Telephone Encounter (Signed)
Patient Advocate Encounter  Resubmitted PA stating the use the OTC Medications used. Prior Authorization for Pantoprazole Sodium 40MG  dr tablets has been approved.    Key: [id number] Effective dates: 06/04/2022 through 06/02/2023

## 2022-06-15 ENCOUNTER — Encounter: Payer: BC Managed Care – PPO | Attending: General Surgery | Admitting: Dietician

## 2022-06-15 ENCOUNTER — Encounter: Payer: Self-pay | Admitting: Dietician

## 2022-06-15 VITALS — Ht 63.0 in | Wt 264.6 lb

## 2022-06-15 DIAGNOSIS — E669 Obesity, unspecified: Secondary | ICD-10-CM | POA: Insufficient documentation

## 2022-06-15 NOTE — Progress Notes (Signed)
Nutrition Assessment for Bariatric Surgery Medical Nutrition Therapy Appt Start Time: 9:25    End Time: 10:50  Patient was seen on 06/15/2022 for Pre-Operative Nutrition Assessment. Letter of approval faxed to [facility] bariatric surgery program coordinator on 06/15/2022.   Referral stated Supervised Weight Loss (SWL) visits needed: 0  Not cleared at this time:  Pt to follow up for minimum of one more visit to assist pt with progressing through stages of change/further nutrition education. RD advised pt that this follow up visit is not mandated through insurance. Pt verbalized agreement.  Planned surgery: Sleeve Gastrectomy   NUTRITION ASSESSMENT   Anthropometrics  Start weight at [facility]: 264.6 lbs (date: 06/15/2022)  Height: 63 in BMI: 46.87 kg/m2     Clinical  Medical hx: Obesity, Anxiety, Arthritis Medications: Klonopin, Cosentyx, Prilosec, Vit D, Adderall, Lexapro, junel fe, armodafinil, methotrexate, doxepin, folic acid, gabapentin  Labs: Chol 227; triglycerides 150; Vit D 20.41; LDL 151: A1c 6.5 Notable signs/symptoms: none noted Any previous deficiencies? No  Micronutrient Nutrition Focused Physical Exam: Hair: No issues observed Eyes: No issues observed Mouth: No issues observed Neck: No issues observed Nails: No issues observed Skin: No issues observed  Lifestyle & Dietary Hx  Patient lives with her son. The pt performs the food shopping and the pt prepares the meals. She reports that she typically skips or misses 12 out of 21 possible meals per week. She may have 9 meals per week that are take-out or at a restaurant.  Patient works as Art therapist. She denies binge eating though has felt shame and/or guilt after eating too much food.  She denies having used laxatives or vomiting to facilitate weight loss. She denies emotional eating during times of stress. She states that she knows the difference between hunger and thirst and can tell when she is  full. Pt states once she got off prednisone her weight became stable.  Pt states she gained most of her weight between 2020 and 2022, stating she gained about 100 pounds.  Physical Activity: ADLs  Sleep Hygiene: duration and quality: good  Current Patient Perceived Stress Level as stated by pt on a scale of 1-10:  6      Stress Management Techniques: self talk  Fruit servings per week: 0 Non starchy vegetable servings per week: 0-1 Whole Grains per week: 0   24-Hr Dietary Recall First Meal: skip or sausage biscuit coffee Snack:  Second Meal: fast food Snack: ice cream on weekend Third Meal: dinner at a restaurant Snack: ice cream Beverages: coffee, water, alcohol ([employer])  Alcoholic beverages per week: 7   Estimated Energy Needs Calories: 1500  NUTRITION DIAGNOSIS  Overweight/obesity ([place]-3.3) related to past poor dietary habits and physical inactivity as evidenced by patient w/ planned sleeve surgery following dietary guidelines for continued weight loss.    NUTRITION INTERVENTION  Nutrition counseling (C-1) and education (E-2) to facilitate bariatric surgery goals.  Educated pt on micronutrient deficiencies post surgery and strategies to mitigate that risk   Pre-Op Goals Reviewed with the Patient Track food and beverage intake (pen and paper, MyFitness Pal, Baritastic app, etc.) Make healthy food choices while monitoring portion sizes Consume 3 meals per day or try to eat every 3-5 hours Avoid concentrated sugars and fried foods Keep sugar & fat in the single digits per serving on food labels Practice CHEWING your food (aim for applesauce consistency) Practice not drinking 15 minutes before, during, and 30 minutes after each meal and snack Avoid all  carbonated beverages (ex: soda, sparkling beverages)  Limit caffeinated beverages (ex: coffee, tea, energy drinks) Avoid all sugar-sweetened beverages (ex: regular soda, sports drinks)  Avoid alcohol  Aim for  64-100 ounces of FLUID daily (with at least half of fluid intake being plain water)  Aim for at least 60-80 grams of PROTEIN daily Look for a liquid protein source that contains ?15 g protein and ?5 g carbohydrate (ex: shakes, drinks, shots) Make a list of non-food related activities Physical activity is an important part of a healthy lifestyle so keep it moving! The goal is to reach 150 minutes of exercise per week, including cardiovascular and weight baring activity.  *Goals that are bolded indicate the pt would like to start working towards these  Handouts Provided Include  Bariatric Surgery handouts (Nutrition Visits, Pre-Op Goals, Protein Shakes, Vitamins & Minerals)  Learning Style & Readiness for Change Teaching method utilized: Visual & Auditory  Demonstrated degree of understanding via: Teach Back  Readiness Level: preparation Barriers to learning/adherence to lifestyle change: busy work schedule  RD's Notes for Next Visit Patient progress toward chosen goals     MONITORING & EVALUATION Dietary intake, weekly physical activity, body weight, and pre-op goals reached at next nutrition visit.    Next Steps  Patient is to follow up at [place] in three weeks for progress toward goals and further nutrition education.

## 2022-06-25 ENCOUNTER — Encounter: Payer: Self-pay | Admitting: Nurse Practitioner

## 2022-07-09 ENCOUNTER — Ambulatory Visit: Payer: BC Managed Care – PPO | Admitting: Dietician

## 2022-07-13 ENCOUNTER — Encounter: Payer: Self-pay | Admitting: Dietician

## 2022-07-13 ENCOUNTER — Encounter: Payer: BC Managed Care – PPO | Attending: General Surgery | Admitting: Dietician

## 2022-07-13 VITALS — Ht 63.0 in | Wt 262.1 lb

## 2022-07-13 DIAGNOSIS — E669 Obesity, unspecified: Secondary | ICD-10-CM | POA: Diagnosis not present

## 2022-07-13 NOTE — Progress Notes (Signed)
Supervised Weight Loss Visit Bariatric Nutrition Education Appt Start Time: 9:15    End Time: 9:50  Referral stated Supervised Weight Loss (SWL) visits needed: 0  Not cleared at this time:  Pt to follow up for minimum of one more visit to assist pt with progressing through stages of change/further nutrition education. RD advised pt that this follow up visit is not mandated through insurance. Pt verbalized agreement.   Planned surgery: Sleeve Gastrectomy   NUTRITION ASSESSMENT   Anthropometrics  Start weight at [facility]: 264.6 lbs (date: 06/15/2022)  Height: 63 in Weight today: 262.1 lbs BMI:  kg/m2     Clinical  Medical hx: Obesity, Anxiety, Arthritis Medications: Klonopin, Cosentyx, Prilosec, Vit D, Adderall, Lexapro, junel fe, armodafinil, methotrexate, doxepin, folic acid, gabapentin  Labs: Chol 227; triglycerides 150; Vit D 20.41; LDL 151: A1c 6.5 Notable signs/symptoms: none noted Any previous deficiencies? No  Lifestyle & Dietary Hx  Pt states she continues to work on her fluids on her job, stating at work she does fine.  Pt states she has positioned her Stanley cup where she can access. Pt also continues to work on physical activity, stating her father had emergency surgery and son got the flu. Pt states she will wake up 15 minutes early to ride a stationary bike 3-4 days a week. Pt states she has not had an alcoholic drink since last visit, stating she is wondering what the point was to having a drink when she got home from work. Pt states she is doing much better eating throughout the day. Pt states she does not like the texture of chicken, stating her son is the exact same way. Pt states she has not had craving for ice cream, stating if she has a sweet craving, she will eat one Dove candy (chocolate heart). Pt states she has started practicing not drinking with her meals, stating she is working on the 30 minutes after.  Pt states that is the hard part. Pt states she wants to  continue to come for visits prior to surgery. Pt states her biggest fear after surgery is the dehydration. Pt states she has been working on knowing when satisfied.  Estimated daily fluid intake: 64 oz Supplements: Done with megadose of Vit D Current average weekly physical activity: 15 minutes early to ride a stationary bike 3-4 days a week.  24-Hr Dietary Recall First Meal: "Two" yogurt and berries Snack: rice cakes Second Meal: kale salad kit, with deli [place] to add protein Snack: cashews Third Meal: non-starchy vegetables, meat loaf Snack: ice cream Beverages: coffee, water  Alcoholic beverages per week: pt states she has not had a drink since last visit.  Estimated Energy Needs Calories: 1500  NUTRITION DIAGNOSIS  Overweight/obesity ([place]-3.3) related to past poor dietary habits and physical inactivity as evidenced by patient w/ planned sleeve surgery following dietary guidelines for continued weight loss.  Encouraged pt to continue to eat balanced meals inclusive of non starchy vegetables 2 times a day 7 days a week Why you need complex carbohydrates: Whole grains and other complex carbohydrates are required to have a healthy diet. Whole grains provide fiber which can help with blood glucose levels and help keep you satiated. Fruits and starchy vegetables provide essential vitamins and minerals required for immune function, eyesight support, brain support, bone density, wound healing and many other functions within the body. According to the current evidenced based 2020-2025 Dietary Guidelines for Americans, complex carbohydrates are part of a healthy eating pattern which is associated  with a decreased risk for type 2 diabetes, cancers, and cardiovascular disease.   Encouraged pt to continue to drink a minium 64 fluid ounces with half being plain water to satisfy proper hydration     NUTRITION INTERVENTION  Nutrition counseling (C-1) and education (E-2) to facilitate bariatric  surgery goals.  Pre-Op Goals Reviewed with the Patient Track food and beverage intake (pen and paper, MyFitness Pal, Baritastic app, etc.) Make healthy food choices while monitoring portion sizes Consume 3 meals per day or try to eat every 3-5 hours Avoid concentrated sugars and fried foods Keep sugar & fat in the single digits per serving on food labels Practice CHEWING your food (aim for applesauce consistency) Practice not drinking 15 minutes before, during, and 30 minutes after each meal and snack Avoid all carbonated beverages (ex: soda, sparkling beverages)  Limit caffeinated beverages (ex: coffee, tea, energy drinks) Avoid all sugar-sweetened beverages (ex: regular soda, sports drinks)  Avoid alcohol  Aim for 64-100 ounces of FLUID daily (with at least half of fluid intake being plain water)  Aim for at least 60-80 grams of PROTEIN daily Look for a liquid protein source that contains ?15 g protein and ?5 g carbohydrate (ex: shakes, drinks, shots) Make a list of non-food related activities Physical activity is an important part of a healthy lifestyle so keep it moving! The goal is to reach 150 minutes of exercise per week, including cardiovascular and weight baring activity.  *Goals that are bolded indicate the pt would like to start working towards these  Pre-Op Goals Progress & New Goals Continue: practice recognizing satisfaction vs. Fullness Continue: avoiding alcohol Continue: practicing not drinking with meals, focusing on waiting 30 minutes after meal or snack Continue: consuming 3 meals a day; trying to eat every 3-5 hours Continue: aim for 150 minutes of physical activity, including cardiovascular and weight baring activity New: use meal ideas handout to make healthy food choices (while monitoring portion sizes)  Handouts Provided Include  Meal Ideas Handout  Learning Style & Readiness for Change Teaching method utilized: Visual & Auditory  Demonstrated degree of  understanding via: Teach Back  Readiness Level: preparation Barriers to learning/adherence to lifestyle change: busy work schedule  RD's Notes for next Visit  Patient progress toward chosen goals.  MONITORING & EVALUATION Dietary intake, weekly physical activity, body weight, and pre-op goals.   Next Steps  Patient is to return to [facility] in one month for further education.

## 2022-08-06 ENCOUNTER — Encounter: Payer: Self-pay | Admitting: Nurse Practitioner

## 2022-08-17 ENCOUNTER — Ambulatory Visit: Payer: BC Managed Care – PPO | Admitting: Nurse Practitioner

## 2022-08-20 ENCOUNTER — Encounter: Payer: BC Managed Care – PPO | Attending: General Surgery | Admitting: Dietician

## 2022-08-20 ENCOUNTER — Encounter: Payer: Self-pay | Admitting: Dietician

## 2022-08-20 VITALS — Ht 63.0 in | Wt 270.3 lb

## 2022-08-20 DIAGNOSIS — E669 Obesity, unspecified: Secondary | ICD-10-CM | POA: Insufficient documentation

## 2022-08-20 NOTE — Progress Notes (Signed)
Supervised Weight Loss Visit Bariatric Nutrition Education Appt Start Time: 10:36    End Time: 11:05  Referral stated Supervised Weight Loss (SWL) visits needed: 0  Pt completed visits.   Pt has cleared nutrition requirements.   Planned surgery: Sleeve Gastrectomy   NUTRITION ASSESSMENT   Anthropometrics  Start weight at [facility]: 264.6 lbs (date: 06/15/2022)  Height: 63 in Weight today: 270.3 lbs BMI:  kg/m2     Clinical  Medical hx: Obesity, Anxiety, Arthritis Medications: Klonopin, Cosentyx, Prilosec, Vit D, Adderall, Lexapro, junel fe, armodafinil, methotrexate, doxepin, folic acid, gabapentin  Labs: Chol 227; triglycerides 150; Vit D 20.41; LDL 151: A1c 6.5 Notable signs/symptoms: none noted Any previous deficiencies? No  Lifestyle & Dietary Hx  Pt states since last visit she had COVID, stating that is the first time she has had it. Pt states she stopped taking paxlovid. Pt states she has gotten strict with weight and measurement for food. Pt states her friend owns [employer] [place], stating she got fluids there while she was recovering from sickness. Pt states her biggest fear after surgery is the dehydration. Pt states it is getting much easier feeling the sense of satisfaction vs. fullness, stating it is miserable to get to fullness. Pt states very few people know that she is pursuing surgery. Pt states it has been her birthday week. Pt states she has found a tea called "smooth move" for constipation, stating she has used it once or twice a week. Pt states she has had to use Linzess in the past. Pt states her co-workers are noticing her discipline with eating patterns and motivation to get ready for surgery. Pt states she did find a gym that she likes, stating it is not your typical gym. Pt states it is boxing or another one that is circuit training. Pt states she want to have the surgery the 2nd to last week in July. Pt states she is using [employer] to shop, stating  she only gets what is on her list and saves money too.  Estimated daily fluid intake: 64 oz Supplements: Done with megadose of Vit D Current average weekly physical activity: 20 minutes early to ride a stationary bike 3-4 days a week.  24-Hr Dietary Recall First Meal: "Two" yogurt and berries or Fairlife protein shake Snack: rice cakes Second Meal: kale salad kit, with deli [place] to add protein Snack: cashews Third Meal: non-starchy vegetables, meat loaf or chicken breast, [place] Hamme rice, broccoli. Snack: ice cream Beverages: coffee, water  Alcoholic beverages per week: pt states she has not had a drink since last visit.  Estimated Energy Needs Calories: 1500  NUTRITION DIAGNOSIS  Overweight/obesity ([place]-3.3) related to past poor dietary habits and physical inactivity as evidenced by patient w/ planned sleeve surgery following dietary guidelines for continued weight loss.  Encouraged patient to honor their body's internal hunger and fullness cues.  Throughout the day, check in mentally and rate hunger. Stop eating when satisfied not full regardless of how much food is left on the plate.  Get more if still hungry 20-30 minutes later.  The key is to honor satisfaction so throughout the meal, rate fullness factor and stop when comfortably satisfied not physically full. The key is to honor hunger and fullness without any feelings of guilt or shame.  Pay attention to what the internal cues are, rather than any external factors. This will enhance the confidence you have in listening to your own body and following those internal cues enabling you to increase  how often you eat when you are hungry not out of appetite and stop when you are satisfied not full.  Encouraged pt to continue to eat balanced meals inclusive of non starchy vegetables 2 times a day 7 days a week Encouraged pt to choose lean protein sources: limiting beef, pork, sausage, hotdogs, and lunch meat Encourage pt to choose healthy fats  such as plant based limiting animal fats Encouraged pt to continue to drink a minium 64 fluid ounces with half being plain water to satisfy proper hydration    Pre-Op Goals Progress & New Goals Continue: practice recognizing satisfaction vs. Fullness Continue: avoiding alcohol Continue: practicing not drinking with meals, focusing on waiting 30 minutes after meal or snack Continue: consuming 3 meals a day; trying to eat every 3-5 hours Continue: aim for 150 minutes of physical activity, including cardiovascular and weight baring activity Continue: use meal ideas handout to make healthy food choices (while monitoring portion sizes) New: track protein  Handouts Provided Include  Protein Foods List with protein values  Learning Style & Readiness for Change Teaching method utilized: Visual & Auditory  Demonstrated degree of understanding via: Teach Back  Readiness Level: preparation Barriers to learning/adherence to lifestyle change: busy work schedule  RD's Notes for next Visit  Patient progress toward chosen goals.  MONITORING & EVALUATION Dietary intake, weekly physical activity, body weight, and pre-op goals.   Next Steps  Pt has completed visits. No further supervised visits required/recommended. Patient is to return to [facility] for pre-op class, >2 weeks prior to scheduled surgery.  Pt also requesting to continue to follow-up at [facility] for follow-up until pre-op op class.

## 2022-08-31 ENCOUNTER — Ambulatory Visit: Payer: BC Managed Care – PPO | Admitting: Nurse Practitioner

## 2022-08-31 ENCOUNTER — Telehealth: Payer: Self-pay

## 2022-08-31 VITALS — BP 122/76 | HR 91 | Temp 98.5°F | Ht 63.0 in | Wt 267.6 lb

## 2022-08-31 DIAGNOSIS — G2581 Restless legs syndrome: Secondary | ICD-10-CM | POA: Diagnosis not present

## 2022-08-31 DIAGNOSIS — K219 Gastro-esophageal reflux disease without esophagitis: Secondary | ICD-10-CM

## 2022-08-31 DIAGNOSIS — J0140 Acute pansinusitis, unspecified: Secondary | ICD-10-CM | POA: Diagnosis not present

## 2022-08-31 DIAGNOSIS — R7303 Prediabetes: Secondary | ICD-10-CM | POA: Diagnosis not present

## 2022-08-31 LAB — POCT GLYCOSYLATED HEMOGLOBIN (HGB A1C)
HbA1c POC (<> result, manual entry): 6.5 % (ref 4.0–5.6)
HbA1c, POC (controlled diabetic range): 6.5 % (ref 0.0–7.0)
HbA1c, POC (prediabetic range): 6.5 % — AB (ref 5.7–6.4)
Hemoglobin A1C: 6.5 % — AB (ref 4.0–5.6)

## 2022-08-31 MED ORDER — DOXYCYCLINE HYCLATE 100 MG PO TABS: 100.0000 mg | ORAL_TABLET | Freq: Two times a day (BID) | ORAL | 0 refills | Status: DC

## 2022-08-31 MED ORDER — ROPINIROLE HCL 0.5 MG PO TABS: 0.5000 mg | ORAL_TABLET | Freq: Every day | ORAL | 0 refills | Status: DC

## 2022-08-31 NOTE — Assessment & Plan Note (Signed)
Chronic, not controlled.  Her psychiatrist recently started her on Requip 1 mg at bedtime to help with this.  Discussed taking this medication 2 to 3 hours before she goes to bed and she can increase it to 1.5 mg for 7 days and then to 2 mg every night.  Recent iron panel and vitamin B12 are normal.

## 2022-08-31 NOTE — Assessment & Plan Note (Signed)
Chronic, stable.  Pantoprazole is helping with her symptoms, will continue this 40 mg daily.  Follow-up in 3 months.

## 2022-08-31 NOTE — Patient Instructions (Signed)
Visit Information  Thank you for taking time to visit with me today. Please don't hesitate to contact me if I can be of assistance to you.   Following are the goals we discussed today:   Goals Addressed             This Visit's Progress    Care coordination activities-No follow up required       Care Coordination Interventions: Advised patient to Annual Wellness exam. Discussed [facility] services and support. Assessed SDOH. Advised to discuss with primary care physician if services needed in the future.         If you are experiencing a Mental Health or Behavioral Health Crisis or need someone to talk to, please call the Suicide and Crisis Lifeline: 988   Patient verbalizes understanding of instructions and care plan provided today and agrees to view in MyChart. Active MyChart status and patient understanding of how to access instructions and care plan via MyChart confirmed with patient.     The patient has been provided with contact information for the care management team and has been advised to call with any health related questions or concerns.   Bary Leriche, RN, MSN [facility] Care Management Care Management Coordinator Direct Line [phone]

## 2022-08-31 NOTE — Assessment & Plan Note (Signed)
A1c today 6.5%.  She has borderline diabetes at this point.  She has gone through the prerequisites for bariatric surgery and is waiting for this to be scheduled.  Continue focusing on nutrition and exercise.

## 2022-08-31 NOTE — Patient Instructions (Signed)
It was great to see you!  Start doxycycline twice a day with food for 10 days.   Keep taking the flonase and zyrtec.   Increase your requip to 1.5mg  1-2 hours before you go to bed for 7 days, then increase to 2mg  at bedtime  Let's follow-up in 3 months, sooner if you have concerns.  If a referral was placed today, you will be contacted for an appointment. Please note that routine referrals can sometimes take up to 3-4 weeks to process. Please call our office if you haven't heard anything after this time frame.  Take care,  Rodman Pickle, NP

## 2022-08-31 NOTE — Progress Notes (Signed)
Established Patient Office Visit  Subjective   Patient ID: Rhonda Baker, female    DOB: Jun 07, 1985  Age: 37 y.o. MRN: [id number]  Chief Complaint  Patient presents with   Gastroesophageal Reflux    Follow up, sinus issues, feels fluid in both ears     HPI  Rhonda Baker is here to follow up on GERD. She also notes that she is experiencing restless leg syndrome at night and a possible sinus infection.   She has been experiencing sinus pain, nasal congestion, and fullness in her ears for the past week. She denies fevers and sick contacts. She has been taking mucinex, flonase, and using a humidifier.   She also notes that she has been experiencing restless leg syndrome at night for several years. She states that her psychiatrist started her on requip 1mg , 2 months ago and it is not helping. She takes gabapentin 3 times a a day for chronic pain.   She states that her GERD is getting better. The protonix has helped control her symptoms.   She has completed the steps for weight loss surgery and is just waiting to schedule the date. She has been meeting with a nutritionist regularly.     ROS See pertinent positives and negatives per HPI.    Objective:     BP 122/76 (BP Location: Right Arm)   Pulse 91   Temp 98.5 F (36.9 C)   Ht 5\' 3"  (1.6 m)   Wt 267 lb 9.6 oz (121.4 kg)   SpO2 96%   BMI 47.40 kg/m    Physical Exam Vitals and nursing note reviewed.  Constitutional:      General: She is not in acute distress.    Appearance: Normal appearance.  HENT:     Head: Normocephalic.     Right Ear: Tympanic membrane, ear canal and external ear normal.     Left Ear: Tympanic membrane, ear canal and external ear normal.     Nose:     Right Sinus: Maxillary sinus tenderness and frontal sinus tenderness present.     Left Sinus: Maxillary sinus tenderness and frontal sinus tenderness present.  Eyes:     Conjunctiva/sclera: Conjunctivae normal.  Cardiovascular:     Rate and  Rhythm: Normal rate and regular rhythm.     Pulses: Normal pulses.     Heart sounds: Normal heart sounds.  Pulmonary:     Effort: Pulmonary effort is normal.     Breath sounds: Normal breath sounds.  Musculoskeletal:     Cervical back: Normal range of motion.  Skin:    General: Skin is warm.  Neurological:     General: No focal deficit present.     Mental Status: She is alert and oriented to person, place, and time.  Psychiatric:        Mood and Affect: Mood normal.        Behavior: Behavior normal.        Thought Content: Thought content normal.        Judgment: Judgment normal.      Assessment & Plan:   Problem List Items Addressed This Visit       Digestive   Gastroesophageal reflux disease    Chronic, stable.  Pantoprazole is helping with her symptoms, will continue this 40 mg daily.  Follow-up in 3 months.        Other   Prediabetes    A1c today 6.5%.  She has borderline diabetes at this point.  She has gone through the prerequisites for bariatric surgery and is waiting for this to be scheduled.  Continue focusing on nutrition and exercise.      Relevant Orders   POCT glycosylated hemoglobin (Hb A1C) (Completed)   Restless leg syndrome    Chronic, not controlled.  Her psychiatrist recently started her on Requip 1 mg at bedtime to help with this.  Discussed taking this medication 2 to 3 hours before she goes to bed and she can increase it to 1.5 mg for 7 days and then to 2 mg every night.  Recent iron panel and vitamin B12 are normal.      Other Visit Diagnoses     Acute non-recurrent pansinusitis    -  Primary   Start doxycycline 100mg  BID x10 days. Continue OTC medications. Encourage fluids. F/U if not improving.   Relevant Medications   doxycycline (VIBRA-TABS) 100 MG tablet       Return in about 3 months (around 11/30/2022).    Rhonda ScullLauren A Jaimi Belle, NP

## 2022-08-31 NOTE — Patient Outreach (Signed)
  Care Coordination   In Person Provider Office Visit Note   08/31/2022 Name: Rhonda Baker MRN: [id number] DOB: 09-06-1985  Rhonda Baker is a 37 y.o. year old female who sees McElwee, Jake Church, NP for primary care. I engaged with Rhonda Baker in the providers office today.  What matters to the patients health and wellness today?  Pending gastric surgery    Goals Addressed             This Visit's Progress    Care coordination activities-No follow up required       Care Coordination Interventions: Advised patient to Annual Wellness exam. Discussed [facility] services and support. Assessed SDOH. Advised to discuss with primary care physician if services needed in the future.         SDOH assessments and interventions completed:  Yes  SDOH Interventions Today    Flowsheet Row Most Recent Value  SDOH Interventions   Utilities Interventions Intervention Not Indicated  Alcohol Usage Interventions Intervention Not Indicated (Score <7)        Care Coordination Interventions:  Yes, provided   Follow up plan: No further intervention required.   Encounter Outcome:  Pt. Visit Completed   Rhonda Leriche, RN, MSN [facility] Care Management Care Management Coordinator Direct Line [phone]

## 2022-09-21 ENCOUNTER — Other Ambulatory Visit: Payer: Self-pay | Admitting: Nurse Practitioner

## 2022-09-21 NOTE — Telephone Encounter (Signed)
Requesting: PANTOPRAZOLE SOD DR 40 MG TAB  Last Visit: 08/31/2022 Next Visit: 01/25/2023 Last Refill: 05/23/2022  Please Advise

## 2022-09-22 ENCOUNTER — Other Ambulatory Visit: Payer: Self-pay | Admitting: Nurse Practitioner

## 2022-09-28 ENCOUNTER — Ambulatory Visit: Payer: BC Managed Care – PPO | Admitting: Dietician

## 2022-10-08 DIAGNOSIS — Z124 Encounter for screening for malignant neoplasm of cervix: Secondary | ICD-10-CM | POA: Diagnosis not present

## 2022-10-08 DIAGNOSIS — Z01419 Encounter for gynecological examination (general) (routine) without abnormal findings: Secondary | ICD-10-CM | POA: Diagnosis not present

## 2022-10-08 DIAGNOSIS — Z6841 Body Mass Index (BMI) 40.0 and over, adult: Secondary | ICD-10-CM | POA: Diagnosis not present

## 2022-10-12 DIAGNOSIS — G629 Polyneuropathy, unspecified: Secondary | ICD-10-CM | POA: Diagnosis not present

## 2022-10-12 DIAGNOSIS — R600 Localized edema: Secondary | ICD-10-CM | POA: Diagnosis not present

## 2022-10-12 DIAGNOSIS — L4059 Other psoriatic arthropathy: Secondary | ICD-10-CM | POA: Diagnosis not present

## 2022-10-12 DIAGNOSIS — R21 Rash and other nonspecific skin eruption: Secondary | ICD-10-CM | POA: Diagnosis not present

## 2022-10-29 ENCOUNTER — Encounter: Payer: BC Managed Care – PPO | Attending: General Surgery | Admitting: Skilled Nursing Facility1

## 2022-10-29 ENCOUNTER — Encounter: Payer: Self-pay | Admitting: Skilled Nursing Facility1

## 2022-10-29 NOTE — Progress Notes (Signed)
Pre-Operative Nutrition Class:    Patient was seen on 10/29/2022 for Pre-Operative Bariatric Surgery Education [facility]    Surgery date: 12/17/2022 Surgery type: sleeve Start weight at [facility]: 264.6 Weight today: 268.5   The following the learning objectives were met by the patient during this course: Identify Pre-Op Dietary Goals and will begin 2 weeks pre-operatively Identify appropriate sources of fluids and proteins  State protein recommendations and appropriate sources pre and post-operatively Identify Post-Operative Dietary Goals and will follow for 2 weeks post-operatively Identify appropriate multivitamin and calcium sources Describe the need for physical activity post-operatively and will follow MD recommendations State when to call healthcare provider regarding medication questions or post-operative complications When having a diagnosis of diabetes understanding hypoglycemia symptoms and the inclusion of 1 complex carbohydrate per meal  Handouts given during class include: Pre-Op Bariatric Surgery Diet Handout Protein Shake Handout Post-Op Bariatric Surgery Nutrition Handout BELT Program Information Flyer Support Group Information Flyer [facility] Bariatric Supplements Price List  Follow-Up Plan: Patient will follow-up at [facility] 2 weeks post operatively for diet advancement per MD.

## 2022-11-22 ENCOUNTER — Ambulatory Visit: Payer: Self-pay | Admitting: General Surgery

## 2022-12-03 NOTE — Patient Instructions (Addendum)
SURGICAL WAITING ROOM VISITATION Patients having surgery or a procedure may have no more than 2 support people in the waiting area - these visitors may rotate.    Children under the age of 86 must have an adult with them who is not the patient.  If the patient needs to stay at the hospital during part of their recovery, the visitor guidelines for inpatient rooms apply. Pre-op nurse will coordinate an appropriate time for 1 support person to accompany patient in pre-op.  This support person may not rotate.    Please refer to the [facility] website for the visitor guidelines for Inpatients (after your surgery is over and you are in a regular room).      Your procedure is scheduled on: 12-17-22   Report to [facility] Main Entrance    Report to admitting at 5:15 AM   Call this number if you have problems the morning of surgery [phone]   Do not eat food :After 6:00 PM the night before surgery.   After Midnight you may have the following liquids until 4:30 AM DAY OF SURGERY  Water Non-Citrus Juices (without pulp, NO RED-Apple, White grape, White cranberry) Black Coffee (NO MILK/CREAM OR CREAMERS, sugar ok)  Clear Tea (NO MILK/CREAM OR CREAMERS, sugar ok) regular and decaf                             Plain Jell-O (NO RED)                                           Fruit ices (not with fruit pulp, NO RED)                                     Popsicles (NO RED)                                                               Sports drinks like Gatorade (NO RED)                   The day of surgery:  Drink ONE (1) Pre-Surgery G2 at 4:30 AM the morning of surgery. Drink in one sitting. Do not sip.  This drink was given to you during your hospital  pre-op appointment visit. Nothing else to drink after completing the Pre-Surgery G2.          If you have questions, please contact your surgeon's office.   FOLLOW  ANY ADDITIONAL PRE OP INSTRUCTIONS YOU RECEIVED FROM YOUR  SURGEON'S OFFICE!!!     Oral Hygiene is also important to reduce your risk of infection.                                    Remember - BRUSH YOUR TEETH THE MORNING OF SURGERY WITH YOUR REGULAR TOOTHPASTE   Do NOT smoke after Midnight   Take these medicines the morning of surgery with A SIP OF WATER:   Escitalopram  Gabapentin  Pantoprazole  Clonazepam if needed  Tylenol if needed                              You may not have any metal on your body including hair pins, jewelry, and body piercing             Do not wear make-up, lotions, powders, perfumes or deodorant  Do not wear nail polish including gel and S&S, artificial/acrylic nails, or any other type of covering on natural nails including finger and toenails. If you have artificial nails, gel coating, etc. that needs to be removed by a nail salon please have this removed prior to surgery or surgery may need to be canceled/ delayed if the surgeon/ anesthesia feels like they are unable to be safely monitored.   Do not shave  48 hours prior to surgery.    Do not bring valuables to the hospital. [facility] IS NOT RESPONSIBLE   FOR VALUABLES.   Contacts, dentures or bridgework may not be worn into surgery.   Bring small overnight bag day of surgery.   DO NOT BRING YOUR HOME MEDICATIONS TO THE HOSPITAL. PHARMACY WILL DISPENSE MEDICATIONS LISTED ON YOUR MEDICATION LIST TO YOU DURING YOUR ADMISSION IN THE HOSPITAL!               Please read over the following fact sheets you were given: IF YOU HAVE QUESTIONS ABOUT YOUR PRE-OP INSTRUCTIONS PLEASE CALL [phone] [name]  If you received a COVID test during your pre-op visit  it is requested that you wear a mask when out in public, stay away from anyone that may not be feeling well and notify your surgeon if you develop symptoms. If you test positive for Covid or have been in contact with anyone that has tested positive in the last 10 days please notify you surgeon.  [place] -  Preparing for Surgery Before surgery, you can play an important role.  Because skin is not sterile, your skin needs to be as free of germs as possible.  You can reduce the number of germs on your skin by washing with CHG (chlorahexidine gluconate) soap before surgery.  CHG is an antiseptic cleaner which kills germs and bonds with the skin to continue killing germs even after washing. Please DO NOT use if you have an allergy to CHG or antibacterial soaps.  If your skin becomes reddened/irritated stop using the CHG and inform your nurse when you arrive at Short Stay. Do not shave (including legs and underarms) for at least 48 hours prior to the first CHG shower.  You may shave your face/neck.  Please follow these instructions carefully:  1.  Shower with CHG Soap the night before surgery and the  morning of surgery.  2.  If you choose to wash your hair, wash your hair first as usual with your normal  shampoo.  3.  After you shampoo, rinse your hair and body thoroughly to remove the shampoo.                             4.  Use CHG as you would any other liquid soap.  You can apply chg directly to the skin and wash.  Gently with a scrungie or clean washcloth.  5.  Apply the CHG Soap to your body ONLY FROM THE NECK DOWN.   Do   not use  on face/ open                           Wound or open sores. Avoid contact with eyes, ears mouth and   genitals (private parts).                       Wash face,  Genitals (private parts) with your normal soap.             6.  Wash thoroughly, paying special attention to the area where your    surgery  will be performed.  7.  Thoroughly rinse your body with warm water from the neck down.  8.  DO NOT shower/wash with your normal soap after using and rinsing off the CHG Soap.                9.  Pat yourself dry with a clean towel.            10.  Wear clean pajamas.            11.  Place clean sheets on your bed the night of your first shower and do not  sleep with  pets. Day of Surgery : Do not apply any lotions/deodorants the morning of surgery.  Please wear clean clothes to the hospital/surgery center.  FAILURE TO FOLLOW THESE INSTRUCTIONS MAY RESULT IN THE CANCELLATION OF YOUR SURGERY  PATIENT SIGNATURE_________________________________  NURSE SIGNATURE__________________________________  ________________________________________________________________________    Rhonda Baker  An incentive spirometer is a tool that can help keep your lungs clear and active. This tool measures how well you are filling your lungs with each breath. Taking long deep breaths may help reverse or decrease the chance of developing breathing (pulmonary) problems (especially infection) following: A long period of time when you are unable to move or be active. BEFORE THE PROCEDURE  If the spirometer includes an indicator to show your best effort, your nurse or respiratory therapist will set it to a desired goal. If possible, sit up straight or lean slightly forward. Try not to slouch. Hold the incentive spirometer in an upright position. INSTRUCTIONS FOR USE  Sit on the edge of your bed if possible, or sit up as far as you can in bed or on a chair. Hold the incentive spirometer in an upright position. Breathe out normally. Place the mouthpiece in your mouth and seal your lips tightly around it. Breathe in slowly and as deeply as possible, raising the piston or the ball toward the top of the column. Hold your breath for 3-5 seconds or for as long as possible. Allow the piston or ball to fall to the bottom of the column. Remove the mouthpiece from your mouth and breathe out normally. Rest for a few seconds and repeat Steps 1 through 7 at least 10 times every 1-2 hours when you are awake. Take your time and take a few normal breaths between deep breaths. The spirometer may include an indicator to show your best effort. Use the indicator as a goal to work toward during  each repetition. After each set of 10 deep breaths, practice coughing to be sure your lungs are clear. If you have an incision (the cut made at the time of surgery), support your incision when coughing by placing a pillow or rolled up towels firmly against it. Once you are able to get out of bed, walk around indoors and cough well. You may stop  using the incentive spirometer when instructed by your caregiver.  RISKS AND COMPLICATIONS Take your time so you do not get dizzy or light-headed. If you are in pain, you may need to take or ask for pain medication before doing incentive spirometry. It is harder to take a deep breath if you are having pain. AFTER USE Rest and breathe slowly and easily. It can be helpful to keep track of a log of your progress. Your caregiver can provide you with a simple table to help with this. If you are using the spirometer at home, follow these instructions: SEEK MEDICAL CARE IF:  You are having difficultly using the spirometer. You have trouble using the spirometer as often as instructed. Your pain medication is not giving enough relief while using the spirometer. You develop fever of 100.5 F (38.1 C) or higher. SEEK IMMEDIATE MEDICAL CARE IF:  You cough up bloody sputum that had not been present before. You develop fever of 102 F (38.9 C) or greater. You develop worsening pain at or near the incision site. MAKE SURE YOU:  Understand these instructions. Will watch your condition. Will get help right away if you are not doing well or get worse. Document Released: 09/24/2006 Document Revised: 08/06/2011 Document Reviewed: 11/25/2006 [employer] Patient Information 2014 [employer], [place].   ________________________________________________________________________ WHAT IS A BLOOD TRANSFUSION? Blood Transfusion Information  A transfusion is the replacement of blood or some of its parts. Blood is made up of multiple cells which provide different functions. Red  blood cells carry oxygen and are used for blood loss replacement. White blood cells fight against infection. Platelets control bleeding. Plasma helps clot blood. Other blood products are available for specialized needs, such as hemophilia or other clotting disorders. BEFORE THE TRANSFUSION  Who gives blood for transfusions?  Healthy volunteers who are fully evaluated to make sure their blood is safe. This is blood bank blood. Transfusion therapy is the safest it has ever been in the practice of medicine. Before blood is taken from a donor, a complete history is taken to make sure that person has no history of diseases nor engages in risky social behavior (examples are intravenous drug use or sexual activity with multiple partners). The donor's travel history is screened to minimize risk of transmitting infections, such as malaria. The donated blood is tested for signs of infectious diseases, such as HIV and hepatitis. The blood is then tested to be sure it is compatible with you in order to minimize the chance of a transfusion reaction. If you or a relative donates blood, this is often done in anticipation of surgery and is not appropriate for emergency situations. It takes many days to process the donated blood. RISKS AND COMPLICATIONS Although transfusion therapy is very safe and saves many lives, the main dangers of transfusion include:  Getting an infectious disease. Developing a transfusion reaction. This is an allergic reaction to something in the blood you were given. Every precaution is taken to prevent this. The decision to have a blood transfusion has been considered carefully by your caregiver before blood is given. Blood is not given unless the benefits outweigh the risks. AFTER THE TRANSFUSION Right after receiving a blood transfusion, you will usually feel much better and more energetic. This is especially true if your red blood cells have gotten low (anemic). The transfusion raises the  level of the red blood cells which carry oxygen, and this usually causes an energy increase. The nurse administering the transfusion will monitor you carefully  for complications. HOME CARE INSTRUCTIONS  No special instructions are needed after a transfusion. You may find your energy is better. Speak with your caregiver about any limitations on activity for underlying diseases you may have. SEEK MEDICAL CARE IF:  Your condition is not improving after your transfusion. You develop redness or irritation at the intravenous (IV) site. SEEK IMMEDIATE MEDICAL CARE IF:  Any of the following symptoms occur over the next 12 hours: Shaking chills. You have a temperature by mouth above 102 F (38.9 C), not controlled by medicine. Chest, back, or muscle pain. People around you feel you are not acting correctly or are confused. Shortness of breath or difficulty breathing. Dizziness and fainting. You get a rash or develop hives. You have a decrease in urine output. Your urine turns a dark color or changes to pink, red, or brown. Any of the following symptoms occur over the next 10 days: You have a temperature by mouth above 102 F (38.9 C), not controlled by medicine. Shortness of breath. Weakness after normal activity. The white part of the eye turns yellow (jaundice). You have a decrease in the amount of urine or are urinating less often. Your urine turns a dark color or changes to pink, red, or brown. Document Released: 05/11/2000 Document Revised: 08/06/2011 Document Reviewed: 12/29/2007 [facility] Patient Information 2014 [place], [place].  _______________________________________________________________________

## 2022-12-03 NOTE — Progress Notes (Addendum)
COVID Vaccine Completed:  Yes  Date of COVID positive in last 90 days: No  PCP - Rodman Pickle, NP Cardiologist - N/A  Chest x-ray - 06-01-22 Epic EKG - 06-01-22 Epic Stress Test -  N/A ECHO -  N/A Cardiac Cath -  N/A Pacemaker/ICD device last checked: Spinal Cord Stimulator: N/A  Bowel Prep -  N/A  Sleep Study - Yes, neg sleep apnea CPAP - No  Prediabetes Fasting Blood Sugar -  Checks Blood Sugar - does not check   Last dose of GLP1 agonist-  N/A GLP1 instructions:  N/A   Last dose of SGLT-2 inhibitors-  N/A SGLT-2 instructions: N/A   Blood Thinner Instructions:   N/A Aspirin Instructions: Last Dose:  Activity level:  Can go up a flight of stairs and perform activities of daily living without stopping and without symptoms of chest pain or shortness of breath.  Anesthesia review:  N/A  Patient denies shortness of breath, fever, cough and chest pain at PAT appointment  Patient verbalized understanding of instructions that were given to them at the PAT appointment. Patient was also instructed that they will need to review over the PAT instructions again at home before surgery.

## 2022-12-06 DIAGNOSIS — M0609 Rheumatoid arthritis without rheumatoid factor, multiple sites: Secondary | ICD-10-CM | POA: Diagnosis not present

## 2022-12-06 DIAGNOSIS — K2100 Gastro-esophageal reflux disease with esophagitis, without bleeding: Secondary | ICD-10-CM | POA: Diagnosis not present

## 2022-12-06 DIAGNOSIS — I10 Essential (primary) hypertension: Secondary | ICD-10-CM | POA: Diagnosis not present

## 2022-12-07 ENCOUNTER — Encounter (HOSPITAL_COMMUNITY): Payer: Self-pay

## 2022-12-07 ENCOUNTER — Other Ambulatory Visit: Payer: Self-pay

## 2022-12-07 ENCOUNTER — Encounter (HOSPITAL_COMMUNITY)
Admission: RE | Admit: 2022-12-07 | Discharge: 2022-12-07 | Disposition: A | Discharge status: Home / Self Care | Payer: BC Managed Care – PPO | Source: Ambulatory Visit | Attending: General Surgery | Admitting: General Surgery

## 2022-12-07 DIAGNOSIS — Z01812 Encounter for preprocedural laboratory examination: Secondary | ICD-10-CM | POA: Diagnosis not present

## 2022-12-07 LAB — COMPREHENSIVE METABOLIC PANEL
ALT: 26 U/L (ref 0–44)
AST: 24 U/L (ref 15–41)
Albumin: 4 g/dL (ref 3.5–5.0)
Alkaline Phosphatase: 55 U/L (ref 38–126)
Anion gap: 9 (ref 5–15)
BUN: 12 mg/dL (ref 6–20)
CO2: 24 mmol/L (ref 22–32)
Calcium: 8.8 mg/dL — ABNORMAL LOW (ref 8.9–10.3)
Chloride: 100 mmol/L (ref 98–111)
Creatinine, Ser: 0.82 mg/dL (ref 0.44–1.00)
GFR, Estimated: >60 mL/min (ref >60)
Glucose, Bld: 105 mg/dL — ABNORMAL HIGH (ref 70–99)
Potassium: 3.7 mmol/L (ref 3.5–5.1)
Sodium: 133 mmol/L — ABNORMAL LOW (ref 135–145)
Total Bilirubin: 0.5 mg/dL (ref 0.3–1.2)
Total Protein: 7.8 g/dL (ref 6.5–8.1)

## 2022-12-07 LAB — CBC WITH DIFFERENTIAL/PLATELET
Abs Immature Granulocytes: 0.06 10*3/uL (ref 0.00–0.07)
Basophils Absolute: 0.1 10*3/uL (ref 0.0–0.1)
Basophils Relative: 1 %
Eosinophils Absolute: 0.1 10*3/uL (ref 0.0–0.5)
Eosinophils Relative: 1 %
HCT: 39.9 % (ref 36.0–46.0)
Hemoglobin: 13.2 g/dL (ref 12.0–15.0)
Immature Granulocytes: 1 %
Lymphocytes Relative: 24 %
Lymphs Abs: 2.3 10*3/uL (ref 0.7–4.0)
MCH: 32.4 pg (ref 26.0–34.0)
MCHC: 33.1 g/dL (ref 30.0–36.0)
MCV: 98 fL (ref 80.0–100.0)
Monocytes Absolute: 0.6 10*3/uL (ref 0.1–1.0)
Monocytes Relative: 6 %
Neutro Abs: 6.5 10*3/uL (ref 1.7–7.7)
Neutrophils Relative %: 67 %
Platelets: 462 10*3/uL — ABNORMAL HIGH (ref 150–400)
RBC: 4.07 MIL/uL (ref 3.87–5.11)
RDW: 13.1 % (ref 11.5–15.5)
WBC: 9.6 10*3/uL (ref 4.0–10.5)
nRBC: 0 % (ref 0.0–0.2)

## 2022-12-14 DIAGNOSIS — H43811 Vitreous degeneration, right eye: Secondary | ICD-10-CM | POA: Diagnosis not present

## 2022-12-14 DIAGNOSIS — H5213 Myopia, bilateral: Secondary | ICD-10-CM | POA: Diagnosis not present

## 2022-12-17 ENCOUNTER — Inpatient Hospital Stay (HOSPITAL_COMMUNITY)
Admission: RE | Admit: 2022-12-17 | Discharge: 2022-12-18 | DRG: 621 | Disposition: A | Discharge status: Home / Self Care | Payer: BC Managed Care – PPO | Attending: General Surgery | Admitting: General Surgery

## 2022-12-17 ENCOUNTER — Encounter (HOSPITAL_COMMUNITY): Payer: Self-pay | Admitting: General Surgery

## 2022-12-17 ENCOUNTER — Other Ambulatory Visit: Payer: Self-pay

## 2022-12-17 ENCOUNTER — Inpatient Hospital Stay (HOSPITAL_COMMUNITY): Payer: BC Managed Care – PPO | Admitting: Anesthesiology

## 2022-12-17 ENCOUNTER — Encounter (HOSPITAL_COMMUNITY)
Admission: RE | Disposition: A | Discharge status: Home / Self Care | Payer: Self-pay | Source: Home / Self Care | Attending: General Surgery

## 2022-12-17 DIAGNOSIS — I10 Essential (primary) hypertension: Secondary | ICD-10-CM | POA: Diagnosis present

## 2022-12-17 DIAGNOSIS — Z833 Family history of diabetes mellitus: Secondary | ICD-10-CM

## 2022-12-17 DIAGNOSIS — Z8249 Family history of ischemic heart disease and other diseases of the circulatory system: Secondary | ICD-10-CM | POA: Diagnosis not present

## 2022-12-17 DIAGNOSIS — Z79899 Other long term (current) drug therapy: Secondary | ICD-10-CM | POA: Diagnosis not present

## 2022-12-17 DIAGNOSIS — K219 Gastro-esophageal reflux disease without esophagitis: Secondary | ICD-10-CM | POA: Diagnosis not present

## 2022-12-17 DIAGNOSIS — Z6841 Body Mass Index (BMI) 40.0 and over, adult: Secondary | ICD-10-CM | POA: Diagnosis not present

## 2022-12-17 DIAGNOSIS — M0609 Rheumatoid arthritis without rheumatoid factor, multiple sites: Secondary | ICD-10-CM | POA: Diagnosis not present

## 2022-12-17 DIAGNOSIS — Z885 Allergy status to narcotic agent status: Secondary | ICD-10-CM | POA: Diagnosis not present

## 2022-12-17 DIAGNOSIS — M150 Primary generalized (osteo)arthritis: Secondary | ICD-10-CM | POA: Diagnosis not present

## 2022-12-17 HISTORY — PX: UPPER GI ENDOSCOPY: SHX6162

## 2022-12-17 HISTORY — PX: LAPAROSCOPIC GASTRIC SLEEVE RESECTION: SHX5895

## 2022-12-17 LAB — CBC
HCT: 39.7 % (ref 36.0–46.0)
Hemoglobin: 12.8 g/dL (ref 12.0–15.0)
MCH: 32.9 pg (ref 26.0–34.0)
MCHC: 32.2 g/dL (ref 30.0–36.0)
MCV: 102.1 fL — ABNORMAL HIGH (ref 80.0–100.0)
Platelets: 400 10*3/uL (ref 150–400)
RBC: 3.89 MIL/uL (ref 3.87–5.11)
RDW: 13.1 % (ref 11.5–15.5)
WBC: 11.8 10*3/uL — ABNORMAL HIGH (ref 4.0–10.5)
nRBC: 0 % (ref 0.0–0.2)

## 2022-12-17 LAB — HEMOGLOBIN AND HEMATOCRIT, BLOOD
HCT: 41 % (ref 36.0–46.0)
Hemoglobin: 12.9 g/dL (ref 12.0–15.0)

## 2022-12-17 LAB — TYPE AND SCREEN
ABO/RH(D): A POS
Antibody Screen: NEGATIVE

## 2022-12-17 LAB — GLUCOSE, CAPILLARY: Glucose-Capillary: 107 mg/dL — ABNORMAL HIGH (ref 70–99)

## 2022-12-17 LAB — CREATININE, SERUM
Creatinine, Ser: 0.89 mg/dL (ref 0.44–1.00)
GFR, Estimated: >60 mL/min (ref >60)

## 2022-12-17 LAB — ABO/RH: ABO/RH(D): A POS

## 2022-12-17 SURGERY — GASTRECTOMY, SLEEVE, LAPAROSCOPIC
Anesthesia: General

## 2022-12-17 MED ORDER — FENTANYL CITRATE (PF) 100 MCG/2ML IJ SOLN
INTRAMUSCULAR | Status: DC | PRN
  Administered 2022-12-17: 50 ug via INTRAVENOUS

## 2022-12-17 MED ORDER — PHENYLEPHRINE HCL-NACL 20-0.9 MG/250ML-% IV SOLN
INTRAVENOUS | Status: AC
  Filled 2022-12-17: qty 250

## 2022-12-17 MED ORDER — SIMETHICONE 80 MG PO CHEW
80.0000 mg | CHEWABLE_TABLET | Freq: Four times a day (QID) | ORAL | Status: DC | PRN
  Administered 2022-12-17 – 2022-12-18 (×2): 80 mg via ORAL
  Filled 2022-12-17 (×3): qty 1

## 2022-12-17 MED ORDER — KETAMINE HCL 50 MG/5ML IJ SOSY
PREFILLED_SYRINGE | INTRAMUSCULAR | Status: AC
  Filled 2022-12-17: qty 5

## 2022-12-17 MED ORDER — CHLORHEXIDINE GLUCONATE 0.12 % MT SOLN
15.0000 mL | Freq: Once | OROMUCOSAL | Status: AC
  Administered 2022-12-17: 15 mL via OROMUCOSAL

## 2022-12-17 MED ORDER — PROPOFOL 10 MG/ML IV BOLUS
INTRAVENOUS | Status: AC
  Filled 2022-12-17: qty 20

## 2022-12-17 MED ORDER — ONDANSETRON HCL 4 MG/2ML IJ SOLN
INTRAMUSCULAR | Status: AC
  Filled 2022-12-17: qty 2

## 2022-12-17 MED ORDER — KETOROLAC TROMETHAMINE 30 MG/ML IJ SOLN
INTRAMUSCULAR | Status: AC
  Filled 2022-12-17: qty 1

## 2022-12-17 MED ORDER — FENTANYL CITRATE (PF) 100 MCG/2ML IJ SOLN
INTRAMUSCULAR | Status: AC
  Filled 2022-12-17: qty 2

## 2022-12-17 MED ORDER — ONDANSETRON HCL 4 MG/2ML IJ SOLN
4.0000 mg | Freq: Once | INTRAMUSCULAR | Status: AC | PRN
  Administered 2022-12-17: 4 mg via INTRAVENOUS

## 2022-12-17 MED ORDER — KETOROLAC TROMETHAMINE 30 MG/ML IJ SOLN
30.0000 mg | Freq: Once | INTRAMUSCULAR | Status: AC | PRN
  Administered 2022-12-17: 30 mg via INTRAVENOUS

## 2022-12-17 MED ORDER — HYDROMORPHONE HCL 1 MG/ML IJ SOLN
0.2500 mg | INTRAMUSCULAR | Status: DC | PRN
  Administered 2022-12-17: 0.5 mg via INTRAVENOUS
  Administered 2022-12-17: 0.25 mg via INTRAVENOUS

## 2022-12-17 MED ORDER — ONDANSETRON HCL 4 MG/2ML IJ SOLN
4.0000 mg | INTRAMUSCULAR | Status: DC | PRN
  Filled 2022-12-17 (×2): qty 2

## 2022-12-17 MED ORDER — ORAL CARE MOUTH RINSE: 15.0000 mL | Freq: Once | OROMUCOSAL | Status: AC

## 2022-12-17 MED ORDER — ACETAMINOPHEN 160 MG/5ML PO SOLN
1000.0000 mg | Freq: Three times a day (TID) | ORAL | Status: DC
  Filled 2022-12-17: qty 40.6

## 2022-12-17 MED ORDER — INSULIN ASPART 100 UNIT/ML IJ SOLN: 0.0000 [IU] | INTRAMUSCULAR | Status: DC | PRN

## 2022-12-17 MED ORDER — HEPARIN SODIUM (PORCINE) 5000 UNIT/ML IJ SOLN
5000.0000 [IU] | Freq: Three times a day (TID) | INTRAMUSCULAR | Status: DC
  Administered 2022-12-17 – 2022-12-18 (×2): 5000 [IU] via SUBCUTANEOUS
  Filled 2022-12-17 (×2): qty 1

## 2022-12-17 MED ORDER — CHLORHEXIDINE GLUCONATE CLOTH 2 % EX PADS: 6.0000 | MEDICATED_PAD | Freq: Once | CUTANEOUS | Status: DC

## 2022-12-17 MED ORDER — APREPITANT 40 MG PO CAPS
40.0000 mg | ORAL_CAPSULE | ORAL | Status: AC
  Administered 2022-12-17: 40 mg via ORAL
  Filled 2022-12-17: qty 1

## 2022-12-17 MED ORDER — DEXTROSE-SODIUM CHLORIDE 5-0.45 % IV SOLN: INTRAVENOUS | Status: DC

## 2022-12-17 MED ORDER — LACTATED RINGERS IV SOLN: INTRAVENOUS | Status: DC

## 2022-12-17 MED ORDER — FAMOTIDINE IN NACL 20-0.9 MG/50ML-% IV SOLN
20.0000 mg | Freq: Two times a day (BID) | INTRAVENOUS | Status: DC
  Administered 2022-12-17 – 2022-12-18 (×3): 20 mg via INTRAVENOUS
  Filled 2022-12-17 (×3): qty 50

## 2022-12-17 MED ORDER — CLONAZEPAM 1 MG PO TABS
1.0000 mg | ORAL_TABLET | Freq: Three times a day (TID) | ORAL | Status: DC | PRN
  Administered 2022-12-17: 1 mg via ORAL
  Filled 2022-12-17: qty 1

## 2022-12-17 MED ORDER — MIDAZOLAM HCL 2 MG/2ML IJ SOLN
INTRAMUSCULAR | Status: AC
  Filled 2022-12-17: qty 2

## 2022-12-17 MED ORDER — PROPOFOL 10 MG/ML IV BOLUS
INTRAVENOUS | Status: DC | PRN
  Administered 2022-12-17: 200 mg via INTRAVENOUS

## 2022-12-17 MED ORDER — MIDAZOLAM HCL 5 MG/5ML IJ SOLN
INTRAMUSCULAR | Status: DC | PRN
  Administered 2022-12-17: 2 mg via INTRAVENOUS

## 2022-12-17 MED ORDER — ROCURONIUM BROMIDE 100 MG/10ML IV SOLN
INTRAVENOUS | Status: DC | PRN
  Administered 2022-12-17: 70 mg via INTRAVENOUS

## 2022-12-17 MED ORDER — MORPHINE SULFATE (PF) 2 MG/ML IV SOLN: 1.0000 mg | INTRAVENOUS | Status: DC | PRN

## 2022-12-17 MED ORDER — BUPIVACAINE LIPOSOME 1.3 % IJ SUSP
INTRAMUSCULAR | Status: DC | PRN
  Administered 2022-12-17: 50 mL

## 2022-12-17 MED ORDER — ONDANSETRON HCL 4 MG/2ML IJ SOLN
INTRAMUSCULAR | Status: DC | PRN
  Administered 2022-12-17: 4 mg via INTRAVENOUS

## 2022-12-17 MED ORDER — ESCITALOPRAM OXALATE 20 MG PO TABS
40.0000 mg | ORAL_TABLET | Freq: Every morning | ORAL | Status: DC
  Administered 2022-12-18: 40 mg via ORAL
  Filled 2022-12-17 (×2): qty 2

## 2022-12-17 MED ORDER — OXYCODONE HCL 5 MG/5ML PO SOLN
5.0000 mg | Freq: Four times a day (QID) | ORAL | Status: DC | PRN
  Administered 2022-12-17 – 2022-12-18 (×2): 5 mg via ORAL
  Filled 2022-12-17 (×3): qty 5

## 2022-12-17 MED ORDER — PHENYLEPHRINE HCL-NACL 20-0.9 MG/250ML-% IV SOLN
INTRAVENOUS | Status: DC | PRN
  Administered 2022-12-17: 40 ug/min via INTRAVENOUS

## 2022-12-17 MED ORDER — DEXAMETHASONE SODIUM PHOSPHATE 4 MG/ML IJ SOLN
4.0000 mg | INTRAMUSCULAR | Status: AC
  Administered 2022-12-17: 5 mg via INTRAVENOUS

## 2022-12-17 MED ORDER — 0.9 % SODIUM CHLORIDE (POUR BTL) OPTIME
TOPICAL | Status: DC | PRN
  Administered 2022-12-17: 1000 mL

## 2022-12-17 MED ORDER — ROCURONIUM BROMIDE 10 MG/ML (PF) SYRINGE
PREFILLED_SYRINGE | INTRAVENOUS | Status: AC
  Filled 2022-12-17: qty 10

## 2022-12-17 MED ORDER — HYDRALAZINE HCL 20 MG/ML IJ SOLN
10.0000 mg | INTRAMUSCULAR | Status: DC | PRN
  Filled 2022-12-17: qty 1

## 2022-12-17 MED ORDER — LIDOCAINE HCL (CARDIAC) PF 100 MG/5ML IV SOSY
PREFILLED_SYRINGE | INTRAVENOUS | Status: DC | PRN
  Administered 2022-12-17: 60 mg via INTRAVENOUS

## 2022-12-17 MED ORDER — ENSURE MAX PROTEIN PO LIQD
2.0000 [oz_av] | ORAL | Status: DC
  Administered 2022-12-18 (×3): 2 [oz_av] via ORAL

## 2022-12-17 MED ORDER — HYDRALAZINE HCL 20 MG/ML IJ SOLN
INTRAMUSCULAR | Status: AC
  Filled 2022-12-17: qty 1

## 2022-12-17 MED ORDER — SCOPOLAMINE 1 MG/3DAYS TD PT72
1.0000 | MEDICATED_PATCH | TRANSDERMAL | Status: DC
  Administered 2022-12-17: 1.5 mg via TRANSDERMAL
  Filled 2022-12-17: qty 1

## 2022-12-17 MED ORDER — HYDRALAZINE HCL 20 MG/ML IJ SOLN
10.0000 mg | Freq: Once | INTRAMUSCULAR | Status: AC
  Administered 2022-12-17: 10 mg via INTRAVENOUS

## 2022-12-17 MED ORDER — STERILE WATER FOR IRRIGATION IR SOLN
Status: DC | PRN
  Administered 2022-12-17: 500 mL
  Administered 2022-12-17: 1000 mL

## 2022-12-17 MED ORDER — AMPHETAMINE-DEXTROAMPHETAMINE 10 MG PO TABS
30.0000 mg | ORAL_TABLET | Freq: Two times a day (BID) | ORAL | Status: DC
  Filled 2022-12-17 (×3): qty 3

## 2022-12-17 MED ORDER — ACETAMINOPHEN 500 MG PO TABS
1000.0000 mg | ORAL_TABLET | ORAL | Status: AC
  Administered 2022-12-17: 1000 mg via ORAL
  Filled 2022-12-17: qty 2

## 2022-12-17 MED ORDER — LACTATED RINGERS IR SOLN
Status: DC | PRN
  Administered 2022-12-17: 1000 mL

## 2022-12-17 MED ORDER — SODIUM CHLORIDE 0.9 % IV SOLN
2.0000 g | INTRAVENOUS | Status: AC
  Administered 2022-12-17: 2 g via INTRAVENOUS
  Filled 2022-12-17: qty 2

## 2022-12-17 MED ORDER — BUPIVACAINE LIPOSOME 1.3 % IJ SUSP
INTRAMUSCULAR | Status: AC
  Filled 2022-12-17: qty 20

## 2022-12-17 MED ORDER — ROPINIROLE HCL 0.5 MG PO TABS
1.5000 mg | ORAL_TABLET | Freq: Every day | ORAL | Status: DC
  Administered 2022-12-17: 1.5 mg via ORAL
  Filled 2022-12-17: qty 3

## 2022-12-17 MED ORDER — ACETAMINOPHEN 500 MG PO TABS
1000.0000 mg | ORAL_TABLET | Freq: Three times a day (TID) | ORAL | Status: DC
  Administered 2022-12-17 – 2022-12-18 (×3): 1000 mg via ORAL
  Filled 2022-12-17 (×3): qty 2

## 2022-12-17 MED ORDER — PHENYLEPHRINE HCL (PRESSORS) 10 MG/ML IV SOLN
INTRAVENOUS | Status: DC | PRN
  Administered 2022-12-17 (×2): 100 ug via INTRAVENOUS

## 2022-12-17 MED ORDER — BUPIVACAINE LIPOSOME 1.3 % IJ SUSP: 20.0000 mL | Freq: Once | INTRAMUSCULAR | Status: DC

## 2022-12-17 MED ORDER — HYDROMORPHONE HCL 1 MG/ML IJ SOLN
INTRAMUSCULAR | Status: AC
  Administered 2022-12-17: 0.5 mg via INTRAVENOUS
  Filled 2022-12-17: qty 2

## 2022-12-17 MED ORDER — DEXAMETHASONE SODIUM PHOSPHATE 10 MG/ML IJ SOLN
INTRAMUSCULAR | Status: AC
  Filled 2022-12-17: qty 1

## 2022-12-17 MED ORDER — LIDOCAINE HCL (PF) 2 % IJ SOLN
INTRAMUSCULAR | Status: AC
  Filled 2022-12-17: qty 5

## 2022-12-17 MED ORDER — HEPARIN SODIUM (PORCINE) 5000 UNIT/ML IJ SOLN
5000.0000 [IU] | INTRAMUSCULAR | Status: AC
  Administered 2022-12-17: 5000 [IU] via SUBCUTANEOUS
  Filled 2022-12-17: qty 1

## 2022-12-17 MED ORDER — SUGAMMADEX SODIUM 200 MG/2ML IV SOLN
INTRAVENOUS | Status: DC | PRN
  Administered 2022-12-17: 200 mg via INTRAVENOUS

## 2022-12-17 MED ORDER — BUPIVACAINE HCL 0.25 % IJ SOLN
INTRAMUSCULAR | Status: AC
  Filled 2022-12-17: qty 1

## 2022-12-17 MED ORDER — KETAMINE HCL 10 MG/ML IJ SOLN
INTRAMUSCULAR | Status: DC | PRN
  Administered 2022-12-17: 30 mg via INTRAVENOUS

## 2022-12-17 MED ORDER — DOXEPIN HCL 50 MG PO CAPS
150.0000 mg | ORAL_CAPSULE | Freq: Every day | ORAL | Status: DC
  Administered 2022-12-17: 150 mg via ORAL
  Filled 2022-12-17 (×2): qty 3

## 2022-12-17 SURGICAL SUPPLY — 65 items
ANTIFOG SOL W/FOAM PAD STRL (MISCELLANEOUS) ×1
APL PRP STRL LF DISP 70% ISPRP (MISCELLANEOUS) ×1
APL SKNCLS STERI-STRIP NONHPOA (GAUZE/BANDAGES/DRESSINGS) ×1
APPLIER CLIP ROT 13.4 12 LRG (CLIP)
APR CLP LRG 13.4X12 ROT 20 MLT (CLIP)
BAG COUNTER SPONGE SURGICOUNT (BAG) IMPLANT
BAG LAPAROSCOPIC 12 15 PORT 16 (BASKET) ×2 IMPLANT
BAG RETRIEVAL 12/15 (BASKET) ×1
BAG SPNG CNTER NS LX DISP (BAG)
BENZOIN TINCTURE PRP APPL 2/3 (GAUZE/BANDAGES/DRESSINGS) ×2 IMPLANT
BLADE SURG SZ11 CARB STEEL (BLADE) ×2 IMPLANT
BNDG ADH 1X3 SHEER STRL LF (GAUZE/BANDAGES/DRESSINGS) ×12 IMPLANT
BNDG ADH THN 3X1 STRL LF (GAUZE/BANDAGES/DRESSINGS) ×6
CABLE HIGH FREQUENCY MONO STRZ (ELECTRODE) IMPLANT
CHLORAPREP W/TINT 26 (MISCELLANEOUS) ×2 IMPLANT
CLIP APPLIE ROT 13.4 12 LRG (CLIP) IMPLANT
COVER SURGICAL LIGHT HANDLE (MISCELLANEOUS) ×2 IMPLANT
DRAPE UTILITY XL STRL (DRAPES) ×4 IMPLANT
ELECT REM PT RETURN 15FT ADLT (MISCELLANEOUS) ×2 IMPLANT
GAUZE 4X4 16PLY ~~LOC~~+RFID DBL (SPONGE) ×2 IMPLANT
GLOVE BIOGEL PI IND STRL 7.0 (GLOVE) ×2 IMPLANT
GLOVE SURG SS PI 7.0 STRL IVOR (GLOVE) ×2 IMPLANT
GOWN STRL REUS W/ TWL LRG LVL3 (GOWN DISPOSABLE) ×2 IMPLANT
GOWN STRL REUS W/ TWL XL LVL3 (GOWN DISPOSABLE) IMPLANT
GOWN STRL REUS W/TWL LRG LVL3 (GOWN DISPOSABLE) ×1
GOWN STRL REUS W/TWL XL LVL3 (GOWN DISPOSABLE)
GRASPER SUT TROCAR 14GX15 (MISCELLANEOUS) ×2 IMPLANT
IRRIG SUCT STRYKERFLOW 2 WTIP (MISCELLANEOUS) ×1
IRRIGATION SUCT STRKRFLW 2 WTP (MISCELLANEOUS) ×2 IMPLANT
KIT BASIN OR (CUSTOM PROCEDURE TRAY) ×2 IMPLANT
KIT TURNOVER KIT A (KITS) IMPLANT
MARKER SKIN DUAL TIP RULER LAB (MISCELLANEOUS) IMPLANT
MAT PREVALON FULL STRYKER (MISCELLANEOUS) IMPLANT
NDL SPNL 22GX3.5 QUINCKE BK (NEEDLE) ×2 IMPLANT
NEEDLE SPNL 22GX3.5 QUINCKE BK (NEEDLE) ×1 IMPLANT
PACK UNIVERSAL I (CUSTOM PROCEDURE TRAY) ×2 IMPLANT
RELOAD STAPLE 60 3.6 BLU REG (STAPLE) IMPLANT
RELOAD STAPLE 60 3.8 GOLD REG (STAPLE) IMPLANT
RELOAD STAPLE 60 4.1 GRN THCK (STAPLE) IMPLANT
RELOAD STAPLER BLUE 60MM (STAPLE) ×3 IMPLANT
RELOAD STAPLER GOLD 60MM (STAPLE) ×2 IMPLANT
RELOAD STAPLER GREEN 60MM (STAPLE) IMPLANT
SCISSORS LAP 5X45 EPIX DISP (ENDOMECHANICALS) IMPLANT
SET TUBE SMOKE EVAC HIGH FLOW (TUBING) ×2 IMPLANT
SHEARS HARMONIC ACE PLUS 45CM (MISCELLANEOUS) ×2 IMPLANT
SLEEVE GASTRECTOMY 40FR VISIGI (MISCELLANEOUS) ×2 IMPLANT
SLEEVE Z-THREAD 5X100MM (TROCAR) ×6 IMPLANT
SOLUTION ANTFG W/FOAM PAD STRL (MISCELLANEOUS) ×2 IMPLANT
SPIKE FLUID TRANSFER (MISCELLANEOUS) ×2 IMPLANT
STAPLER ECHELON LONG 3000 60 (ENDOMECHANICALS) ×2 IMPLANT
STAPLER RELOAD BLUE 60MM (STAPLE) ×3
STAPLER RELOAD GOLD 60MM (STAPLE) ×2
STAPLER RELOAD GREEN 60MM (STAPLE)
STRIP CLOSURE SKIN 1/2X4 (GAUZE/BANDAGES/DRESSINGS) ×2 IMPLANT
SUT ETHIBOND 0 36 GRN (SUTURE) IMPLANT
SUT MNCRL AB 4-0 PS2 18 (SUTURE) ×2 IMPLANT
SUT VICRYL 0 TIES 12 18 (SUTURE) ×2 IMPLANT
SYR 20ML LL LF (SYRINGE) ×2 IMPLANT
SYR 50ML LL SCALE MARK (SYRINGE) ×2 IMPLANT
SYS KII OPTICAL ACCESS 15MM (TROCAR) ×1
SYSTEM KII OPTICAL ACCESS 15MM (TROCAR) ×2 IMPLANT
TOWEL OR 17X26 10 PK STRL BLUE (TOWEL DISPOSABLE) ×2 IMPLANT
TOWEL OR NON WOVEN STRL DISP B (DISPOSABLE) ×2 IMPLANT
TROCAR Z-THREAD OPTICAL 5X100M (TROCAR) ×2 IMPLANT
TUBING CONNECTING 10 (TUBING) ×4 IMPLANT

## 2022-12-17 NOTE — Progress Notes (Signed)
PHARMACY CONSULT FOR:  Risk Assessment for Post-Discharge VTE Following Bariatric Surgery  Post-Discharge VTE Risk Assessment: This patient's probability of 30-day post-discharge VTE is increased due to the factors marked: x Sleeve gastrectomy   Liver disorder (transplant, cirrhosis, or nonalcoholic steatohepatitis)   Hx of VTE   Hemorrhage requiring transfusion   GI perforation, leak, or obstruction   ====================================================    Female    Age >/=60 years    BMI >/=50 kg/m2    CHF    Dyspnea at Rest    Paraplegia   x Non-gastric-band surgery    Operation Time >/=3 hr    Return to OR     Length of Stay >/= 3 d   Hypercoagulable condition   Significant venous stasis    Predicted probability of 30-day post-discharge VTE: 0.16%  Recommendation for Discharge: No pharmacologic prophylaxis post-discharge  Rhonda Baker is a 37 y.o. 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  Recent Labs    12/17/22 0952  WBC 11.8*  HGB 12.8  HCT 39.7  PLT 400  CREATININE 0.89   Estimated Creatinine Clearance: 107.3 mL/min (by C-G formula based on SCr of 0.89 mg/dL).    Past Medical History:  Diagnosis Date   Allergies    Allergy 2002   Anxiety    Phreesia 11/25/2019   Depression    [employer]reesia 11/25/2019   GERD (gastroesophageal reflux disease) Last year   Lately, I have been waking up choking on acid   Prediabetes    Psoriatic arthritis (HCC)    RA (rheumatoid arthritis) (HCC)    SERONEGATIVE     Medications Prior to Admission  Medication Sig Dispense Refill Last Dose   acetaminophen (TYLENOL) 500 MG tablet Take 500-1,000 mg by mouth every 6 (six) hours as needed (pain.).   Past Week    amphetamine-dextroamphetamine (ADDERALL) 30 MG tablet Take 30 mg by mouth 2 (two) times daily.   Past Week   Armodafinil 250 MG tablet Take 125 mg by mouth daily as needed (alertness).   Past Week   clonazePAM (KLONOPIN) 1 MG tablet Take 1 mg by mouth 3 (three) times daily as needed for anxiety.   Past Week   COSENTYX SENSOREADY, 300 MG, 150 MG/ML SOAJ Inject 2 Syringes into the skin every 28 (twenty-eight) days.   Past Month   doxepin (SINEQUAN) 75 MG capsule Take 150 mg by mouth at bedtime.   12/16/2022   escitalopram (LEXAPRO) 20 MG tablet Take 1 tablet (20 mg total) by mouth daily. (Patient taking differently: Take 40 mg by mouth in the morning.) 90 tablet 0 12/17/2022 at 0430   folic acid (FOLVITE) 1 MG tablet Take 1 mg by mouth every evening.   Past Week   gabapentin (NEURONTIN) 300 MG capsule Take 300 mg by mouth 3 (three) times daily as needed (pain.).   12/16/2022 at 2100   Homeopathic Products (LEG CRAMPS PO) Take 2 tablets by mouth 3 (three) times daily as needed (leg cramps).   Past Week   ibuprofen (ADVIL) 200 MG tablet Take 600 mg by mouth every 8 (eight) hours as needed (pain.).   Past Month   JUNEL FE 1/20 1-20 MG-MCG tablet Take 1 tablet by mouth at bedtime.  12/16/2022 at 2100   methotrexate (RHEUMATREX) 2.5 MG tablet Take 15 mg by mouth every Friday.   12/14/2022   pantoprazole (PROTONIX) 40 MG tablet TAKE 1 TABLET BY MOUTH EVERY DAY 90 tablet 1 12/17/2022 at 0430   rOPINIRole (REQUIP) 0.5 MG tablet TAKE 1 TABLET (0.5 MG TOTAL) BY MOUTH AT BEDTIME. TAKE WITH REQUIP 1MG  TO EQUAL 1.5MG  90 tablet 1 12/16/2022   rOPINIRole (REQUIP) 1 MG tablet Take 1 mg by mouth at bedtime.   12/16/2022   doxycycline (VIBRA-TABS) 100 MG tablet Take 1 tablet (100 mg total) by mouth 2 (two) times daily. (Patient not taking: Reported on 12/06/2022) 20 tablet 0 Unknown       Cindi Carbon, PharmD 12/17/2022,12:05 PM

## 2022-12-17 NOTE — Discharge Instructions (Signed)

## 2022-12-17 NOTE — Anesthesia Postprocedure Evaluation (Signed)
Anesthesia Post Note  Patient: NOMA QUIJAS  Procedure(s) Performed: LAPAROSCOPIC GASTRIC SLEEVE RESECTION UPPER GI ENDOSCOPY     Patient location during evaluation: PACU Anesthesia Type: General Level of consciousness: awake and alert Pain management: pain level controlled Vital Signs Assessment: post-procedure vital signs reviewed and stable Respiratory status: spontaneous breathing, nonlabored ventilation, respiratory function stable and patient connected to nasal cannula oxygen Cardiovascular status: blood pressure returned to baseline and stable Postop Assessment: no apparent nausea or vomiting Anesthetic complications: no  No notable events documented.  Last Vitals:  Vitals:   12/17/22 1210 12/17/22 1317  BP: 126/74 116/70  Pulse: 73 68  Resp: 14 14  Temp: 36.6 C 36.4 C  SpO2: 95% 96%    Last Pain:  Vitals:   12/17/22 1317  TempSrc: Oral  PainSc:                  Aasiyah Auerbach S

## 2022-12-17 NOTE — Anesthesia Preprocedure Evaluation (Signed)
Anesthesia Evaluation  Patient identified by MRN, date of birth, ID band Patient awake    Reviewed: Allergy & Precautions, H&P , NPO status , Patient's Chart, lab work & pertinent test results  Airway Mallampati: II  TM Distance: <3 FB Neck ROM: Full    Dental no notable dental hx.    Pulmonary neg pulmonary ROS   Pulmonary exam normal breath sounds clear to auscultation       Cardiovascular hypertension, Normal cardiovascular exam Rhythm:Regular Rate:Normal     Neuro/Psych negative neurological ROS  negative psych ROS   GI/Hepatic negative GI ROS, Neg liver ROS,,,  Endo/Other    Morbid obesity  Renal/GU negative Renal ROS  negative genitourinary   Musculoskeletal  (+) Arthritis , Rheumatoid disorders,    Abdominal   Peds negative pediatric ROS (+)  Hematology negative hematology ROS (+)   Anesthesia Other Findings   Reproductive/Obstetrics negative OB ROS                             Anesthesia Physical Anesthesia Plan  ASA: 3  Anesthesia Plan: General   Post-op Pain Management: Tylenol PO (pre-op)*   Induction: Intravenous  PONV Risk Score and Plan: 3 and Ondansetron, Dexamethasone, Midazolam and Treatment may vary due to age or medical condition  Airway Management Planned: Oral ETT  Additional Equipment:   Intra-op Plan:   Post-operative Plan: Extubation in OR  Informed Consent: I have reviewed the patients History and Physical, chart, labs and discussed the procedure including the risks, benefits and alternatives for the proposed anesthesia with the patient or authorized representative who has indicated his/her understanding and acceptance.     Dental advisory given  Plan Discussed with: CRNA and Surgeon  Anesthesia Plan Comments:        Anesthesia Quick Evaluation

## 2022-12-17 NOTE — Anesthesia Procedure Notes (Signed)
Procedure Name: Intubation Date/Time: 12/17/2022 7:38 AM  Performed by: Johnette Abraham, CRNAPre-anesthesia Checklist: Patient identified, Emergency Drugs available, Suction available and Patient being monitored Patient Re-evaluated:Patient Re-evaluated prior to induction Oxygen Delivery Method: Circle System Utilized Preoxygenation: Pre-oxygenation with 100% oxygen Induction Type: IV induction Ventilation: Mask ventilation without difficulty Laryngoscope Size: Glidescope and 3 Grade View: Grade II Tube type: Oral Tube size: 7.0 mm Number of attempts: 1 Airway Equipment and Method: Stylet and Oral airway Placement Confirmation: ETT inserted through vocal cords under direct vision, positive ETCO2 and breath sounds checked- equal and bilateral Secured at: 22 cm Tube secured with: Tape Dental Injury: Teeth and Oropharynx as per pre-operative assessment

## 2022-12-17 NOTE — H&P (Signed)
Chief Complaint: FOLLOW UP VISIT (WEIGHT LOSS)   History of Present Illness: Rhonda Baker is a 37 y.o. female who is seen today for bariatric preop evaluation.  No new medications. No new medical problems  MBSAQIP Preoperative Questions  Prior Metabolic and Bariatric Procedure: no Functional health Status: Independent Current Smoker: no Diabetes Mellitus (If diet controlled or no medications, mark no): No Immunosuppressive therapy: yes Pre-op Covid 19 Diagnosis: No History of Severe COPD: no History of PE: no Sleep Apnea Requiring CPAP: no GERD Requiring Daily Medication: yes Previous Foregut Surgery: no Liver Disease: no Severe liver Disease (Portal Hypertension or Liver Failure): no History of MI: no Previous PCI/PTCA: no Previous Cardiac Surgery: no Ventricular Assist Device: no Heart Failure: no Hypertension Requiring Medications: yes # of Hypertension Medications: 1 Hyperlipidemia Requiring Medication: no History of VTE Requiring Therapy: no Therapeutic Anticoagulation: no Venous Stasis: no IVC Filter Placed: No Currently Requiring Dialysis: no Renal Insufficiency: no  Review of Systems: A complete review of systems was obtained from the patient. I have reviewed this information and discussed as appropriate with the patient. See HPI as well for other ROS.  Review of Systems  Constitutional: Negative.  HENT: Negative.  Eyes: Negative.  Respiratory: Negative.  Cardiovascular: Negative.  Gastrointestinal: Negative.  Genitourinary: Negative.  Musculoskeletal: Negative.  Skin: Negative.  Neurological: Negative.  Endo/Heme/Allergies: Negative.  Psychiatric/Behavioral: Negative.    Medical History: Past Medical History:  Diagnosis Date  Anxiety  Arthritis  Hyperlipidemia  Hypertension   There is no problem list on file for this patient.  Past Surgical History:  Procedure Laterality Date  Removal of Endometrial Tissue 2004  CESAREAN SECTION 2011     Allergies  Allergen Reactions  Oxycodone-Acetaminophen Hives, Itching and Other (See Comments)   Current Outpatient Medications on File Prior to Visit  Medication Sig Dispense Refill  clonazePAM (KLONOPIN) 1 MG tablet Take 1 tablet by mouth 3 (three) times daily as needed  COSENTYX PEN 150 mg/mL PnIj 300mg  Subcutaneous every 4 weeks for 28 days  dextroamphetamine-amphetamine (ADDERALL) 30 mg tablet TAKE 1-2 TABLETS BY MOUTH DAILY AS NEEDED (11/28)  doxepin (SINEQUAN) 75 MG capsule TAKE TWO CAPSULES 45 MINUTES BEFORE BED SLEEP AS NEEDED.  escitalopram oxalate (LEXAPRO) 20 MG tablet TAKE 2 DAILY FOR MOOD.  folic acid (FOLVITE) 1 MG tablet 1 tablet Orally Once a day for 90 days  gabapentin (NEURONTIN) 300 MG capsule 1 capsule Orally three a day  methotrexate (RHEUMATREX) 2.5 MG tablet TAKE 6 TABLETS BY MOUTH ONE TIME PER WEEK  norethindrone-ethinyl estradiol (JUNEL 1/20, 21,) 1-20 mg-mcg tablet 1 tablet Orally Once a day for 21 day(s)  pantoprazole (PROTONIX) 40 MG DR tablet Take 1 tablet by mouth once daily  rOPINIRole (REQUIP) 1 MG tablet Take 1 mg by mouth every evening   No current facility-administered medications on file prior to visit.   Family History  Problem Relation Age of Onset  Obesity Mother  High blood pressure (Hypertension) Father  Diabetes Father  Obesity Father  Sleep apnea Father  Sleep apnea Brother    Social History   Tobacco Use  Smoking Status Never  Smokeless Tobacco Never    Social History   Socioeconomic History  Marital status: Divorced  Tobacco Use  Smoking status: Never  Smokeless tobacco: Never  Vaping Use  Vaping status: Never Used  Substance and Sexual Activity  Alcohol use: Yes  Comment: A few per week  Drug use: Never   Social Determinants of Corporate investment banker  Strain: Low Risk (08/31/2022)  Received from [facility]  Overall Financial Resource Strain (CARDIA)  Difficulty of Paying Living Expenses: Not hard at all   Food Insecurity: No Food Insecurity (08/31/2022)  Received from [facility]  Hunger Vital Sign  Worried About Running Out of Food in the Last Year: Never true  Ran Out of Food in the Last Year: Never true  Transportation Needs: No Transportation Needs (08/31/2022)  Received from [facility] - Transportation  Lack of Transportation (Medical): No  Lack of Transportation (Non-Medical): No  Physical Activity: Insufficiently Active (08/31/2022)  Received from [facility]  Exercise Vital Sign  Days of Exercise per Week: 3 days  Minutes of Exercise per Session: 20 min  Stress: Stress Concern Present (08/31/2022)  Received from [facility] - Occupational Stress Questionnaire  Feeling of Stress : To some extent  Social Connections: Unknown (08/31/2022)  Received from [facility]  Social Connection and Isolation Panel [NHANES]  Frequency of Communication with Friends and Family: More than three times a week  Frequency of Social Gatherings with Friends and Family: More than three times a week  Attends Religious Services: More than 4 times per year  Marital Status: Divorced   Objective:   Vitals:  12/06/22 0922  BP: 104/70  Pulse: 99  Temp: 36.8 C (98.3 F)  SpO2: (!) 90%  Weight: (!) 120.5 kg (265 lb 9.6 oz)  Height: 160.7 cm (5' 3.25")  PainSc: 0-No pain   Body mass index is 46.68 kg/m.  Physical Exam Constitutional:  Appearance: Normal appearance.  HENT:  Head: Normocephalic and atraumatic.  Pulmonary:  Effort: Pulmonary effort is normal.  Musculoskeletal:  General: Normal range of motion.  Cervical back: Normal range of motion.  Neurological:  General: No focal deficit present.  Mental Status: She is alert and oriented to person, place, and time. Mental status is at baseline.  Psychiatric:  Mood and Affect: Mood normal.  Behavior: Behavior normal.  Thought Content: Thought content normal.     Labs from 04/2022  reviewed UGI and CXR reviewed both normal  Assessment and Plan:   Diagnoses and all orders for this visit:  Morbid (severe) obesity due to excess calories (CMS/HHS-HCC)  Rheumatoid arthritis of multiple sites with negative rheumatoid factor (CMS/HHS-HCC)  Essential hypertension  Gastroesophageal reflux disease with esophagitis without hemorrhage    The patient meets weight loss surgery criteria. Due to the above reasons, I think minimally invasive vertical sleeve gastrectomy is the best option for the patient.   We discussed sleeve gastrectomy. We discussed the preoperative, operative and postoperative process. I explained the surgery in detail including the performance of an EGD near the end of the surgery to test for leak. We discussed the typical hospital course including a 1-2 day stay baring any complications. The patient was given educational material. I quoted the patient that most patients can lose up to 50-70% of their excess weight. We did discuss the possibility of weight regain several years after the procedure.   The risks of infection, bleeding, pain, scarring, weight regain, too little or too much weight loss, vitamin deficiencies and need for lifelong vitamin supplementation, hair loss, need for protein supplementation, leaks, stricture, reflux, food intolerance, gallstone formation, hernia, need for reoperation, need for open surgery, injury to spleen or surrounding structures, DVT's, PE, and death again discussed with the patient and the patient expressed understanding and desires to proceed with minimally invasive sleeve gastrectomy, possible open, intraoperative  endoscopy.  We discussed that before and after surgery that there would be an alteration in their diet. I explained that we may put them on a diet 2 weeks before surgery. I also explained that they would be on a liquid diet for 2 weeks after surgery. We discussed that they would have to avoid certain foods after  surgery. We discussed the importance of physical activity as well as compliance with our dietary and supplement recommendations and routine follow-up.

## 2022-12-17 NOTE — Progress Notes (Signed)
   12/17/22 1426  TOC Brief Assessment  Insurance and Status Reviewed  Patient has primary care physician Yes  Home environment has been reviewed Resides with children  Prior level of function: Independent at baseline  Prior/Current Home Services No current home services  Social Determinants of Health Reivew SDOH reviewed no interventions necessary  Readmission risk has been reviewed Yes  Transition of care needs no transition of care needs at this time

## 2022-12-17 NOTE — Op Note (Signed)
**Note Rhonda-Identified via Obfuscation** Preop Diagnosis: Obesity Class III  Postop Diagnosis: same  Procedure performed: laparoscopic Sleeve Gastrectomy  Assitant: Puja Maczis, PAC  Indications:  The patient is a 37 y.o. year-old morbidly obese female who has been followed in the Bariatric Clinic as an outpatient. This patient was diagnosed with morbid obesity with a BMI of Body mass index is 46.69 kg/m. and significant co-morbidities including GERD and osteoarthritis.  The patient was counseled extensively in the Bariatric Outpatient Clinic and after a thorough explanation of the risks and benefits of surgery (including death from complications, bowel leak, infection such as peritonitis and/or sepsis, internal hernia, bleeding, need for blood transfusion, bowel obstruction, organ failure, pulmonary embolus, deep venous thrombosis, wound infection, incisional hernia, skin breakdown, and others entailed on the consent form) and after a compliant diet and exercise program, the patient was scheduled for an elective laparoscopic sleeve gastrectomy.  Description of Operation:  Following informed consent, the patient was taken to the operating room and placed on the operating table in the supine position.  She had previously received prophylactic antibiotics and subcutaneous heparin for DVT prophylaxis in the pre-op holding area.  After induction of general endotracheal anesthesia by the anesthesiologist, the patient underwent placement of sequential compression devices and an oro-gastric tube.  A timeout was confirmed by the surgery and anesthesia teams.  The patient was adequately padded at all pressure points and placed on a footboard to prevent slippage from the OR table during extremes of position during surgery.  She underwent a routine sterile prep and drape of her entire abdomen.    Next, A transverse incision was made under the left subcostal area and a 5mm optical viewing trocar was introduced into the peritoneal cavity.  Pneumoperitoneum was applied with a high flow and low pressure. A laparoscope was inserted to confirm placement. A extraperitoneal block was then placed at the lateral abdominal wall using exparel diluted with marcaine. 5 additional incisions were placed: 1 5mm trocar to the left of the midline. 1 additional 5mm trocar in the left lateral area, 1 12mm trocar in the right mid abdomen, 1 5mm trocar in the right subcostal area, and a Nathanson retractor was placed through a subxiphoid incision.  On evaluation of the abdomen, the gallbladder appeared distended and pink in color.  Next, a hole was created through the lesser omentum along the greater curve of the stomach to enter the lesser sac. The vessels along the greater omentum were  Then ligated and divided using the Harmonic scalpel moving towards the spleen and then short gastric vessels were ligated and divided in the same fashion to fully mobilize the fundus. The left crus was identified to ensure completion of the dissection. Next the antrum was measured and dissection continued inferiorly along the greater curve towards the pylorus and stopped 6cm from the pylorus.   A 40Fr ViSiGi dilator was placed into the esophgaus and along the lesser curve of the stomach and placed on suction. 2 60mm Gold load echelon stapler(s) followed by 3 60mm blue load echelon stapler(s) were used to make the resection along the antrum being sure to stay well away from the angularis by angling the jaws of the stapler towards the greater curve and later completing the resection staying along the ViSiGi and ensuring the fundus was not retained by appropriately retracting it lateral. Air was inserted through the ViSiGi to perform a leak test showing no bubbles and a neutral lie of the stomach.  The assistant then went and performed an  upper endoscopy and leak test.  No bubbles were seen and the sleeve and antrum distended appropriately. The specimen was then placed in an  endocatch bag and removed by the 15mm port. The pressure was changed to 8 mm Hg. One area proximal to the first staple line had bleeding and harmonic scalpel was applied with good hemostasis. Hemostasis was ensured. The fascia of the 15mm port was closed with a 0 vicryl by suture passer. Pneumoperitoneum was evacuated, all ports were removed and all incisions closed with 4-0 monocryl suture in subcuticular fashion. Steristrips and bandaids were put in place for dressing. The patient awoke from anesthesia and was brought to pacu in stable condition. All counts were correct.  Estimated blood loss: <6ml  Specimens:  Sleeve gastrectomy  Local Anesthesia: 50 ml Exparel:0.5% Marcaine mix  Post-Op Plan:       Pain Management: PO, prn      Antibiotics: Prophylactic      Anticoagulation: Prophylactic, Starting now      Post Op Studies/Consults: Not applicable      Intended Discharge: within 48h      Intended Outpatient Follow-Up: Two Week      Intended Outpatient Studies: Not Applicable      Other: Not Applicable   Rhonda Baker

## 2022-12-17 NOTE — Transfer of Care (Signed)
Immediate Anesthesia Transfer of Care Note  Patient: Rhonda Baker  Procedure(s) Performed: LAPAROSCOPIC GASTRIC SLEEVE RESECTION UPPER GI ENDOSCOPY  Patient Location: PACU  Anesthesia Type:General  Level of Consciousness: awake, alert , oriented, and patient cooperative  Airway & Oxygen Therapy: Patient Spontanous Breathing and Patient connected to face mask oxygen  Post-op Assessment: Report given to RN, Post -op Vital signs reviewed and stable, and Patient moving all extremities  Post vital signs: Reviewed and stable  Last Vitals:  Vitals Value Taken Time  BP 152/91 12/17/22 0916  Temp 36.9 C 12/17/22 0916  Pulse 81 12/17/22 0919  Resp 12 12/17/22 0919  SpO2 97 % 12/17/22 0919  Vitals shown include unfiled device data.  Last Pain:  Vitals:   12/17/22 0551  TempSrc: Oral  PainSc:       Patients Stated Pain Goal: 3 (12/17/22 0547)  Complications: No notable events documented.

## 2022-12-18 ENCOUNTER — Encounter (HOSPITAL_COMMUNITY): Payer: Self-pay | Admitting: General Surgery

## 2022-12-18 ENCOUNTER — Other Ambulatory Visit (HOSPITAL_COMMUNITY): Payer: Self-pay

## 2022-12-18 LAB — CBC WITH DIFFERENTIAL/PLATELET
Abs Immature Granulocytes: 0.04 10*3/uL (ref 0.00–0.07)
Basophils Absolute: 0 10*3/uL (ref 0.0–0.1)
Basophils Relative: 0 %
Eosinophils Absolute: 0 10*3/uL (ref 0.0–0.5)
Eosinophils Relative: 0 %
HCT: 38.1 % (ref 36.0–46.0)
Hemoglobin: 12.3 g/dL (ref 12.0–15.0)
Immature Granulocytes: 0 %
Lymphocytes Relative: 31 %
Lymphs Abs: 3.2 10*3/uL (ref 0.7–4.0)
MCH: 32.4 pg (ref 26.0–34.0)
MCHC: 32.3 g/dL (ref 30.0–36.0)
MCV: 100.3 fL — ABNORMAL HIGH (ref 80.0–100.0)
Monocytes Absolute: 0.6 10*3/uL (ref 0.1–1.0)
Monocytes Relative: 6 %
Neutro Abs: 6.5 10*3/uL (ref 1.7–7.7)
Neutrophils Relative %: 63 %
Platelets: 380 10*3/uL (ref 150–400)
RBC: 3.8 MIL/uL — ABNORMAL LOW (ref 3.87–5.11)
RDW: 12.9 % (ref 11.5–15.5)
WBC: 10.4 10*3/uL (ref 4.0–10.5)
nRBC: 0 % (ref 0.0–0.2)

## 2022-12-18 MED ORDER — OXYCODONE HCL 5 MG PO TABS
5.0000 mg | ORAL_TABLET | Freq: Four times a day (QID) | ORAL | 0 refills | Status: DC | PRN
  Filled 2022-12-18: qty 10, 3d supply, fill #0

## 2022-12-18 MED ORDER — ACETAMINOPHEN 500 MG PO TABS: 1000.0000 mg | ORAL_TABLET | Freq: Three times a day (TID) | ORAL | Status: AC

## 2022-12-18 MED ORDER — ONDANSETRON 4 MG PO TBDP
4.0000 mg | ORAL_TABLET | Freq: Four times a day (QID) | ORAL | 0 refills | Status: AC | PRN
  Filled 2022-12-18: qty 20, 5d supply, fill #0

## 2022-12-18 NOTE — Plan of Care (Signed)
Patient is stable for discharge. Discharge instructions have been given, all questions have been answered. Patient is discharged to home with family.

## 2022-12-18 NOTE — Progress Notes (Signed)
Patient alert and oriented, pain is controlled. Patient is tolerating fluids, advanced to protein shake today, patient is tolerating well. Reviewed Gastric sleeve/bypass discharge instructions with patient and patient is able to articulate understanding. Provided information on BELT program, Support Group, BSTOP-D, and [facility] outpatient pharmacy. Communicated general update of patient status to surgeon. All questions answered. 24hr fluid recall is 360 mL not including protein shakes per hydration protocol, bariatric nurse coordinator to make follow-up phone call within one week.

## 2022-12-18 NOTE — Discharge Summary (Signed)
Physician Discharge Summary  Rhonda Baker WJX:[id number] DOB: 05/13/1986 DOA: 12/17/2022  PCP: Gerre Scull, NP  Admit date: 12/17/2022 Discharge date:  12/18/2022   Recommendations for Outpatient Follow-up:   (include homehealth, outpatient follow-up instructions, specific recommendations for PCP to follow-up on, etc.)   Follow-up Information     Maczis, Hedda Slade, PA-C Follow up on 01/08/2023.   Specialty: General Surgery Why: Please arrive 15 minutes prior to your appointment at 2:30pm with Rhonda Baker on behalf of Dr. Paris Lore information: [address] [facility] [place] [address] [phone]         Rhonda Baker, De Blanch, MD Follow up on 02/13/2023.   Specialty: General Surgery Why: Please arrive 15 minutes prior to your appointment at 10am Contact information: [address] [place] [address] [phone]                Discharge Diagnoses:  Principal Problem:   Morbid (severe) obesity due to excess calories [facility])   Surgical Procedure: laparoscopic sleeve gastrectomy, upper endoscopy  Discharge Condition: Good Disposition: Home  Diet recommendation: Postoperative sleeve gastrectomy diet (liquids only)  Filed Weights   12/17/22 0547 12/17/22 0647  Weight: 117.9 kg 119.6 kg     Hospital Course:  The patient was admitted after undergoing laparoscopic sleeve gastrectomy. POD 0 she ambulated well. POD 1 she was started on the water diet protocol and tolerated 300 ml in the first shift. Once meeting the water amount she was advanced to bariatric protein shakes which they tolerated and were discharged home POD 1.  Treatments: surgery: laparoscopic sleeve gastrectomy  Discharge Instructions  Discharge Instructions     Ambulate hourly while awake   Complete by: As directed    Call MD for:  difficulty breathing, headache or visual disturbances   Complete by: As directed    Call MD for:   persistant dizziness or light-headedness   Complete by: As directed    Call MD for:  persistant nausea and vomiting   Complete by: As directed    Call MD for:  redness, tenderness, or signs of infection (pain, swelling, redness, odor or green/yellow discharge around incision site)   Complete by: As directed    Call MD for:  severe uncontrolled pain   Complete by: As directed    Call MD for:  temperature >101 F   Complete by: As directed    Diet bariatric full liquid   Complete by: As directed    Discharge wound care:   Complete by: As directed    Remove Bandaids tomorrow, ok to shower tomorrow. Steristrips may fall off in 1-3 weeks.   Incentive spirometry   Complete by: As directed    Perform hourly while awake      Allergies as of 12/18/2022       Reactions   Percocet [oxycodone-acetaminophen] Hives        Medication List     TAKE these medications    acetaminophen 500 MG tablet Commonly known as: TYLENOL Take 500-1,000 mg by mouth every 6 (six) hours as needed (pain.). What changed: Another medication with the same name was added. Make sure you understand how and when to take each.   acetaminophen 500 MG tablet Commonly known as: TYLENOL Take 2 tablets (1,000 mg total) by mouth every 8 (eight) hours for 5 days. What changed: You were already taking a medication with the same name, and this prescription was added. Make sure you  understand how and when to take each.   amphetamine-dextroamphetamine 30 MG tablet Commonly known as: ADDERALL Take 30 mg by mouth 2 (two) times daily. Notes to patient: Schedule an appointment no later than one month post-op with your physician for appropriate management of any psych medications following surgery    Armodafinil 250 MG tablet Take 125 mg by mouth daily as needed (alertness).   clonazePAM 1 MG tablet Commonly known as: KLONOPIN Take 1 mg by mouth 3 (three) times daily as needed for anxiety. Notes to patient: Schedule an  appointment no later than one month post-op with your physician for appropriate management of any psych medications following surgery    Cosentyx Sensoready (300 MG) 150 MG/ML Soaj Generic drug: Secukinumab (300 MG Dose) Inject 2 Syringes into the skin every 28 (twenty-eight) days.   doxepin 75 MG capsule Commonly known as: SINEQUAN Take 150 mg by mouth at bedtime. Notes to patient: Schedule an appointment no later than one month post-op with your physician for appropriate management of any psych medications following surgery    doxycycline 100 MG tablet Commonly known as: VIBRA-TABS Take 1 tablet (100 mg total) by mouth 2 (two) times daily.   escitalopram 20 MG tablet Commonly known as: LEXAPRO Take 1 tablet (20 mg total) by mouth daily. What changed:  how much to take when to take this   folic acid 1 MG tablet Commonly known as: FOLVITE Take 1 mg by mouth every evening.   gabapentin 300 MG capsule Commonly known as: NEURONTIN Take 300 mg by mouth 3 (three) times daily as needed (pain.).   ibuprofen 200 MG tablet Commonly known as: ADVIL Take 600 mg by mouth every 8 (eight) hours as needed (pain.). Notes to patient: Avoid NSAIDs for 6-8 weeks after surgery    Junel FE 1/20 1-20 MG-MCG tablet Generic drug: norethindrone-ethinyl estradiol-FE Take 1 tablet by mouth at bedtime. Notes to patient: Medication may not be effective for the next 30 days.  Use precaution if necessary.     LEG CRAMPS PO Take 2 tablets by mouth 3 (three) times daily as needed (leg cramps).   methotrexate 2.5 MG tablet Commonly known as: RHEUMATREX Take 15 mg by mouth every Friday.   ondansetron 4 MG disintegrating tablet Commonly known as: ZOFRAN-ODT Take 1 tablet (4 mg total) by mouth every 6 (six) hours as needed for nausea or vomiting.   oxyCODONE 5 MG immediate release tablet Commonly known as: Oxy IR/ROXICODONE Take 1 tablet (5 mg total) by mouth every 6 (six) hours as needed for severe  pain.   pantoprazole 40 MG tablet Commonly known as: PROTONIX TAKE 1 TABLET BY MOUTH EVERY DAY   rOPINIRole 1 MG tablet Commonly known as: REQUIP Take 1 mg by mouth at bedtime.   rOPINIRole 0.5 MG tablet Commonly known as: REQUIP TAKE 1 TABLET (0.5 MG TOTAL) BY MOUTH AT BEDTIME. TAKE WITH REQUIP 1MG  TO EQUAL 1.5MG                Discharge Care Instructions  (From admission, onward)           Start     Ordered   12/18/22 0000  Discharge wound care:       Comments: Remove Bandaids tomorrow, ok to shower tomorrow. Steristrips may fall off in 1-3 weeks.   12/18/22 1127            Follow-up Information     Maczis, Hedda Slade, PA-C Follow up on 01/08/2023.   Specialty:  General Surgery Why: Please arrive 15 minutes prior to your appointment at 2:30pm with Rhonda Baker on behalf of Dr. Paris Lore information: [address] [facility] [place] [address] [phone]         Asani Mcburney, De Blanch, MD Follow up on 02/13/2023.   Specialty: General Surgery Why: Please arrive 15 minutes prior to your appointment at 10am Contact information: [address] [place] [address] [phone]                  The results of significant diagnostics from this hospitalization (including imaging, microbiology, ancillary and laboratory) are listed below for reference.    Significant Diagnostic Studies: No results found.  Labs: Basic Metabolic Panel: Recent Labs  Lab 12/17/22 0952  CREATININE 0.89   Liver Function Tests: No results for input(s): "AST", "ALT", "ALKPHOS", "BILITOT", "PROT", "ALBUMIN" in the last 168 hours.  CBC: Recent Labs  Lab 12/17/22 0952 12/17/22 1319 12/18/22 0446  WBC 11.8*  --  10.4  NEUTROABS  --   --  6.5  HGB 12.8 12.9 12.3  HCT 39.7 41.0 38.1  MCV 102.1*  --  100.3*  PLT 400  --  380    CBG: Recent Labs  Lab 12/17/22 0544  GLUCAP 107*    Principal Problem:    Morbid (severe) obesity due to excess calories (HCC)   VTE plan: no chemical prophylaxis recommended ([email or web address])  Time coordinating discharge: 15 min

## 2022-12-19 LAB — SURGICAL PATHOLOGY

## 2022-12-20 ENCOUNTER — Telehealth: Payer: Self-pay

## 2022-12-20 DIAGNOSIS — F3342 Major depressive disorder, recurrent, in full remission: Secondary | ICD-10-CM | POA: Diagnosis not present

## 2022-12-20 DIAGNOSIS — F900 Attention-deficit hyperactivity disorder, predominantly inattentive type: Secondary | ICD-10-CM | POA: Diagnosis not present

## 2022-12-20 NOTE — Transitions of Care (Post Inpatient/ED Visit) (Signed)
12/20/2022  Name: Rhonda Baker MRN: [id number] DOB: [birth year]  Today's TOC FU Call Status: Today's TOC FU Call Status:: Successful TOC FU Call Competed TOC FU Call Complete Date: 12/20/22  Transition Care Management Follow-up Telephone Call Date of Discharge: 12/18/22 Discharge Facility: [facility]) Type of Discharge: Inpatient Admission Primary Inpatient Discharge Diagnosis:: Sleeve Gastrectomy How have you been since you were released from the hospital?: Better Any questions or concerns?: No (Patient feels great, has only needed one Zofran and 2 oxycodone tabs since dc)  Items Reviewed: Did you receive and understand the discharge instructions provided?: Yes Medications obtained,verified, and reconciled?: Partial Review Completed Reason for Partial Mediation Review: Patient was in [place] when she answered TOC call Any new allergies since your discharge?: No Dietary orders reviewed?: Yes Type of Diet Ordered:: Bariatric post op diet (Protein shakes) Do you have support at home?: Yes People in Home: other relative(s) Name of Support/Comfort Primary Source: [place]  Medications Reviewed Today: Medications Reviewed Today     Reviewed by Jodelle Gross, RN (Case Manager) on 12/20/22 at 1056  Med List Status: <None>   Medication Order Taking? Sig Documenting Provider Last Dose Status Informant  acetaminophen (TYLENOL) 500 MG tablet [id number] No Take 500-1,000 mg by mouth every 6 (six) hours as needed (pain.). [provider] Unknown Active Self           Med Note Electa Sniff, [facility]   Thu Dec 20, 2022 10:56 AM) TOC only discussed discharge medications with patient  acetaminophen (TYLENOL) 500 MG tablet [id number]  Take 2 tablets (1,000 mg total) by mouth every 8 (eight) hours for 5 days. Kinsinger, De Blanch, MD  Active   amphetamine-dextroamphetamine (ADDERALL) 30 MG tablet [id number]  Take 30 mg by mouth 2 (two) times daily. [provider]  Active Self   Armodafinil 250 MG tablet [id number]  Take 125 mg by mouth daily as needed (alertness). [provider]  Active Self  clonazePAM (KLONOPIN) 1 MG tablet [id number]  Take 1 mg by mouth 3 (three) times daily as needed for anxiety. [provider]  Active Self  COSENTYX SENSOREADY, 300 MG, 150 MG/ML SOAJ [id number]  Inject 2 Syringes into the skin every 28 (twenty-eight) days. [provider]  Active Self  doxepin (SINEQUAN) 75 MG capsule [id number]  Take 150 mg by mouth at bedtime. [provider]  Active Self  escitalopram (LEXAPRO) 20 MG tablet [id number]  Take 1 tablet (20 mg total) by mouth daily.  Patient taking differently: Take 40 mg by mouth in the morning.   Freddy Finner, NP  Active Self  folic acid (FOLVITE) 1 MG tablet [id number]  Take 1 mg by mouth every evening. [provider]  Active Self  gabapentin (NEURONTIN) 300 MG capsule [id number]  Take 300 mg by mouth 3 (three) times daily as needed (pain.). [provider]  Active Self           Med Note Ellen Henri Sep 05, 2020  7:27 AM)    Homeopathic Products (LEG CRAMPS PO) [id number]  Take 2 tablets by mouth 3 (three) times daily as needed (leg cramps). [provider]  Active Self  ibuprofen (ADVIL) 200 MG tablet [id number]  Take 600 mg by mouth every 8 (eight) hours as needed (pain.). [provider]  Active Self  JUNEL FE 1/20 1-20 MG-MCG tablet [id number]  Take 1 tablet by mouth at bedtime. [provider]  Active Self  methotrexate (RHEUMATREX) 2.5 MG  tablet [id number]  Take 15 mg by mouth every Friday. [provider]  Active Self  ondansetron (ZOFRAN-ODT) 4 MG disintegrating tablet [id number] Yes Take 1 tablet (4 mg) by mouth every 6 hours as needed for nausea or vomiting. Kinsinger, De Blanch, MD Taking Active   oxyCODONE (OXY IR/ROXICODONE) 5 MG immediate release tablet [id number] Yes Take 1 tablet (5 mg) by mouth every 6 hours as  needed for severe pain. Kinsinger, De Blanch, MD Taking Active   pantoprazole (PROTONIX) 40 MG tablet [id number]  TAKE 1 TABLET BY MOUTH EVERY DAY McElwee, Lauren A, NP  Active Self  rOPINIRole (REQUIP) 0.5 MG tablet [id number]  TAKE 1 TABLET (0.5 MG TOTAL) BY MOUTH AT BEDTIME. TAKE WITH REQUIP 1MG  TO EQUAL 1.5MG  McElwee, Lauren A, NP  Active Self  rOPINIRole (REQUIP) 1 MG tablet [id number]  Take 1 mg by mouth at bedtime. [provider]  Active Self            Home Care and Equipment/Supplies: Were Home Health Services Ordered?: No Any new equipment or medical supplies ordered?: No  Functional Questionnaire: Do you need assistance with bathing/showering or dressing?: No Do you need assistance with meal preparation?: No Do you need assistance with eating?: No Do you have difficulty maintaining continence: No Do you need assistance with getting out of bed/getting out of a chair/moving?: No Do you have difficulty managing or taking your medications?: No  Follow up appointments reviewed: PCP Follow-up appointment confirmed?: NA Specialist Hospital Follow-up appointment confirmed?: Yes Date of Specialist follow-up appointment?: 01/01/23 Follow-Up Specialty Provider:: Dr. Kinsinger/Bariatric post op clinic Do you need transportation to your follow-up appointment?: No Do you understand care options if your condition(s) worsen?: Yes-patient verbalized understanding  SDOH Interventions Today    Flowsheet Row Most Recent Value  SDOH Interventions   Food Insecurity Interventions Intervention Not Indicated  Housing Interventions Intervention Not Indicated  Transportation Interventions Intervention Not Indicated      Jodelle Gross, RN, BSN, CCM Care Management Coordinator [facility] Phone: [phone]/Fax: [phone]

## 2022-12-26 ENCOUNTER — Telehealth (HOSPITAL_COMMUNITY): Payer: Self-pay | Admitting: *Deleted

## 2022-12-26 ENCOUNTER — Encounter (INDEPENDENT_AMBULATORY_CARE_PROVIDER_SITE_OTHER): Payer: Self-pay

## 2022-12-26 NOTE — Telephone Encounter (Signed)
Voicemail left with call back number

## 2022-12-31 ENCOUNTER — Telehealth (HOSPITAL_COMMUNITY): Payer: Self-pay | Admitting: *Deleted

## 2022-12-31 NOTE — Telephone Encounter (Signed)
1. Tell me about your pain and pain management?     Pt denies any pain.   2. Let's talk about fluid intake. How much total fluid are you taking in?   Pt states that s/he is getting in at least 128oz of fluid including protein shakes, bottled water  Pt instructed to assess status and suggestions daily utilizing Hydration Action Plan on discharge folder and to call [facility] if in the "red zone".     3. How much protein have you taken in the last day?     Pt states she is meeting the goal of 60g of protein each day with the protein supplements   4. Has the frequency or color changed with your urine?   Pt states that s/he is urinating "fine" with no changes in frequency or urgency.   5. Tell me what your incisions look like?   "Incisions look fine". Pt denies a fever, chills. Pt states incisions are not swollen, open, or draining. Pt encouraged to call [facility] if incisions change.    6. Have you been passing gas? BM?   Pt states that they are having BMs daily. Pt has not needed to take any laxatives. Pt instructed to take either Miralax or MoM as instructed per "Gastric Bypass/Sleeve Discharge Home Care Instructions". Pt to call surgeon's office if not able to have BM with medication.    7. If a problem or question were to arise who would you call? Do you know contact numbers for [facility], [facility], and [facility]?   Pt knows to call [facility] for surgical, [facility] for nutrition, and [facility] for non-urgent questions or concerns. Pt denies dehydration symptoms. Pt can describe s/sx of dehydration.   8. How has the walking going?   Pt states s/he is walking around and able to be active without difficulty.    9. How are your vitamins and calcium going? How are you taking them?     Pt states that s/he is taking his/her supplements and vitamins without difficulty.

## 2023-01-01 ENCOUNTER — Encounter: Payer: BC Managed Care – PPO | Attending: General Surgery | Admitting: Dietician

## 2023-01-01 ENCOUNTER — Encounter: Payer: Self-pay | Admitting: Dietician

## 2023-01-01 VITALS — Ht 63.0 in | Wt 253.6 lb

## 2023-01-01 DIAGNOSIS — E669 Obesity, unspecified: Secondary | ICD-10-CM | POA: Insufficient documentation

## 2023-01-01 NOTE — Progress Notes (Addendum)
2 Week Post-Operative Nutrition Class   Patient was seen on 01/01/2023 for Post-Operative Nutrition education at [facility]    Surgery date: 12/17/2022 Surgery type: sleeve gastrectomy  Anthropometrics  Start weight at [facility]: 264.6 lbs (date: 06/15/2022)  Height: 63 in Weight today: 253.6 lbs   Clinical  Medical hx: Obesity, Anxiety, Arthritis Medications: Klonopin, Cosentyx, Prilosec, Vit D, Adderall, Lexapro, junel fe, armodafinil, methotrexate, doxepin, folic acid, gabapentin  Pt states she went to her psychiatric doctor for adjustment to medication based on weight, stating that is what she was told to do in the pre-op class.  Pt presented note from psychiatric doctor, stating she was there for an appointment. Pt states that the doctor does not base medication on weight, only how she feels. Labs: Chol 227; triglycerides 150; Vit D 20.41; LDL 151: A1c 6.5 Notable signs/symptoms: none noted Any previous deficiencies? No Bowel Habits: Every day to every other day no complaints   Body Composition Scale 01/01/2023  Current Body Weight 253.6  Total Body Fat % 45.6  Visceral Fat 15  Fat-Free Mass % 54.3   Total Body Water % 41.6  Muscle-Mass lbs 31.9  BMI 44.5  Body Fat Displacement          Torso  lbs 71.6         Left Leg  lbs 14.3         Right Leg  lbs 14.3         Left Arm  lbs 7.1         Right Arm  lbs 7.1    The following the learning objectives were met by the patient during this course: Identifies Soft Prepped Plan Advancement Guide  Identifies Soft, High Proteins (Phase 1), beginning 2 weeks post-operatively to 3 weeks post-operatively Identifies Additional Soft High Proteins, soft non-starchy vegetables, fruits and starches (Phase 2), beginning 3 weeks post-operatively to 3 months post-operatively Identifies appropriate sources of fluids, proteins, vegetables, fruits and starches Identifies appropriate fat sources and healthy verses  unhealthy fat types   States protein, vegetable, fruit and starch recommendations and appropriate sources post-operatively Identifies the need for appropriate texture modifications, mastication, and bite sizes when consuming solids Identifies appropriate fat consumption and sources Identifies appropriate multivitamin and calcium sources post-operatively Describes the need for physical activity post-operatively and will follow MD recommendations States when to call healthcare provider regarding medication questions or post-operative complications   Handouts given during class include: Soft Prepped Plan Advancement Guide   Follow-Up Plan: Patient will follow-up at [facility] in 10 weeks for 3 month post-op nutrition visit for diet advancement per MD.

## 2023-01-10 ENCOUNTER — Telehealth: Payer: Self-pay | Admitting: Dietician

## 2023-01-10 NOTE — Telephone Encounter (Signed)
RD called pt to verify fluid intake once starting soft, solid proteins 2 week post-bariatric surgery.   Daily Fluid intake:  Daily Protein intake:  Bowel Habits:   Concerns/issues:    Left Voice Message 

## 2023-01-11 DIAGNOSIS — L4059 Other psoriatic arthropathy: Secondary | ICD-10-CM | POA: Diagnosis not present

## 2023-01-25 ENCOUNTER — Ambulatory Visit: Payer: BC Managed Care – PPO | Admitting: Nurse Practitioner

## 2023-02-08 DIAGNOSIS — Z5181 Encounter for therapeutic drug level monitoring: Secondary | ICD-10-CM | POA: Diagnosis not present

## 2023-02-08 DIAGNOSIS — F3342 Major depressive disorder, recurrent, in full remission: Secondary | ICD-10-CM | POA: Diagnosis not present

## 2023-02-08 DIAGNOSIS — F900 Attention-deficit hyperactivity disorder, predominantly inattentive type: Secondary | ICD-10-CM | POA: Diagnosis not present

## 2023-02-08 DIAGNOSIS — G4726 Circadian rhythm sleep disorder, shift work type: Secondary | ICD-10-CM | POA: Diagnosis not present

## 2023-02-08 DIAGNOSIS — G8929 Other chronic pain: Secondary | ICD-10-CM | POA: Diagnosis not present

## 2023-02-22 ENCOUNTER — Ambulatory Visit (INDEPENDENT_AMBULATORY_CARE_PROVIDER_SITE_OTHER): Payer: BC Managed Care – PPO | Admitting: Nurse Practitioner

## 2023-02-22 ENCOUNTER — Encounter: Payer: Self-pay | Admitting: Nurse Practitioner

## 2023-02-22 VITALS — BP 116/72 | HR 87 | Temp 98.0°F | Ht 63.0 in | Wt 230.8 lb

## 2023-02-22 DIAGNOSIS — K219 Gastro-esophageal reflux disease without esophagitis: Secondary | ICD-10-CM | POA: Diagnosis not present

## 2023-02-22 DIAGNOSIS — R7303 Prediabetes: Secondary | ICD-10-CM | POA: Diagnosis not present

## 2023-02-22 DIAGNOSIS — Z9884 Bariatric surgery status: Secondary | ICD-10-CM | POA: Diagnosis not present

## 2023-02-22 DIAGNOSIS — M62830 Muscle spasm of back: Secondary | ICD-10-CM | POA: Insufficient documentation

## 2023-02-22 LAB — CBC
HCT: 39.8 % (ref 36.0–46.0)
Hemoglobin: 12.9 g/dL (ref 12.0–15.0)
MCHC: 32.5 g/dL (ref 30.0–36.0)
MCV: 97.2 fL (ref 78.0–100.0)
Platelets: 465 10*3/uL — ABNORMAL HIGH (ref 150.0–400.0)
RBC: 4.1 Mil/uL (ref 3.87–5.11)
RDW: 14.6 % (ref 11.5–15.5)
WBC: 8.6 10*3/uL (ref 4.0–10.5)

## 2023-02-22 LAB — BASIC METABOLIC PANEL
BUN: 14 mg/dL (ref 6–23)
CO2: 24 meq/L (ref 19–32)
Calcium: 9.7 mg/dL (ref 8.4–10.5)
Chloride: 103 meq/L (ref 96–112)
Creatinine, Ser: 0.79 mg/dL (ref 0.40–1.20)
GFR: 94.82 mL/min (ref >60.00)
Glucose, Bld: 89 mg/dL (ref 70–99)
Potassium: 4 meq/L (ref 3.5–5.1)
Sodium: 137 meq/L (ref 135–145)

## 2023-02-22 LAB — VITAMIN B12: Vitamin B-12: 792 pg/mL (ref 211–911)

## 2023-02-22 LAB — TSH: TSH: 1.17 u[IU]/mL (ref 0.35–5.50)

## 2023-02-22 LAB — VITAMIN D 25 HYDROXY (VIT D DEFICIENCY, FRACTURES): VITD: 36.88 ng/mL (ref 30.00–100.00)

## 2023-02-22 LAB — HEMOGLOBIN A1C: Hgb A1c MFr Bld: 5.9 % (ref 4.6–6.5)

## 2023-02-22 MED ORDER — CYCLOBENZAPRINE HCL 10 MG PO TABS: 10.0000 mg | ORAL_TABLET | Freq: Three times a day (TID) | ORAL | 0 refills | Status: DC | PRN

## 2023-02-22 MED ORDER — PANTOPRAZOLE SODIUM 40 MG PO TBEC: 40.0000 mg | DELAYED_RELEASE_TABLET | Freq: Every day | ORAL | 1 refills | Status: DC

## 2023-02-22 MED ORDER — ROPINIROLE HCL 0.5 MG PO TABS: 0.5000 mg | ORAL_TABLET | Freq: Every day | ORAL | 1 refills | Status: DC

## 2023-02-22 NOTE — Progress Notes (Signed)
Established Patient Office Visit  Subjective   Patient ID: Rhonda Baker, female    DOB: August 19, 1985  Age: 37 y.o. MRN: [id number]  Chief Complaint  Patient presents with   Medication Management    Follow up    HPI  Rhonda Baker is here to follow-up on chronic disease management and recent bariatric surgery.    She states that she started having lower back spasms that started 1 month ago. She has been stretching and using a foam roller. She has also been using a heating pad frequently. The spasms come on typically every morning. She states that tylenol does not help. She denies falls and injuries. She has been drinking powerade zero along with her vitamins. She also got a new bed which allows a zero gravity position.   She states that her bariatric surgery went well. She is not having any issues post surgery. Her incisions are healing and she is able to eat and drink. She has lost 40 pounds since her surgery. She followed up with her surgeon last week and has an appointment with a nutritionist in November. She needs her labs rechecked that were ordered with her surgeon.     ROS See pertinent positives and negatives per HPI.    Objective:     BP 116/72 (BP Location: Right Arm)   Pulse 87   Temp 98 F (36.7 C) (Oral)   Ht 5\' 3"  (1.6 m)   Wt 230 lb 12.8 oz (104.7 kg)   SpO2 97%   BMI 40.88 kg/m  BP Readings from Last 3 Encounters:  02/22/23 116/72  12/18/22 121/70  12/07/22 (!) 142/87   Wt Readings from Last 3 Encounters:  02/22/23 230 lb 12.8 oz (104.7 kg)  01/01/23 253 lb 9.6 oz (115 kg)  12/17/22 263 lb 9.6 oz (119.6 kg)      Physical Exam Vitals and nursing note reviewed.  Constitutional:      General: She is not in acute distress.    Appearance: Normal appearance.  HENT:     Head: Normocephalic.  Eyes:     Conjunctiva/sclera: Conjunctivae normal.  Cardiovascular:     Rate and Rhythm: Normal rate and regular rhythm.     Pulses: Normal pulses.     Heart  sounds: Normal heart sounds.  Pulmonary:     Effort: Pulmonary effort is normal.     Breath sounds: Normal breath sounds.  Abdominal:     Palpations: Abdomen is soft.     Tenderness: There is no abdominal tenderness.  Musculoskeletal:     Cervical back: Normal range of motion.  Skin:    General: Skin is warm.  Neurological:     General: No focal deficit present.     Mental Status: She is alert and oriented to person, place, and time.  Psychiatric:        Mood and Affect: Mood normal.        Behavior: Behavior normal.        Thought Content: Thought content normal.        Judgment: Judgment normal.     Assessment & Plan:   Problem List Items Addressed This Visit       Digestive   Gastroesophageal reflux disease    Chronic, stable.  Pantoprazole is helping with her symptoms, will continue this 40 mg daily.       Relevant Medications   pantoprazole (PROTONIX) 40 MG tablet     Other   Prediabetes  A1c today 6.5%.  She has borderline diabetes at this point.  She has had bariatric surgery and has lost 40 pounds.  Will recheck A1c today.      Relevant Orders   Hemoglobin A1c   History of bariatric surgery    She had a laparoscopic gastric sleeve in July and has lost 40 pounds since then.  She is doing well, able to eat without nausea or stomach pain.  Her incisions are healing well.  Will check BMP, CBC, vitamin B12, vitamin B1, vitamin D, TSH, iron panel today.      Relevant Orders   CBC   VITAMIN D 25 Hydroxy (Vit-D Deficiency, Fractures)   Vitamin B12   Vitamin B1   Basic metabolic panel   Iron, TIBC and Ferritin Panel   TSH   Back spasm - Primary    She has been experiencing lower back spasms for the last month.  Will have her continue doing stretching, stretches given to her.  She can also continue with massages, using heat.  Will also have her start Flexeril 10 mg 3 times a day as needed, discussed this may make her sleepy.  Follow-up if symptoms worsen or with  any concerns.       Return in about 6 months (around 08/22/2023) for CPE.    Gerre Scull, NP

## 2023-02-22 NOTE — Assessment & Plan Note (Addendum)
Chronic, stable.  Pantoprazole is helping with her symptoms, will continue this 40 mg daily.

## 2023-02-22 NOTE — Assessment & Plan Note (Signed)
She had a laparoscopic gastric sleeve in July and has lost 40 pounds since then.  She is doing well, able to eat without nausea or stomach pain.  Her incisions are healing well.  Will check BMP, CBC, vitamin B12, vitamin B1, vitamin D, TSH, iron panel today.

## 2023-02-22 NOTE — Assessment & Plan Note (Signed)
She has been experiencing lower back spasms for the last month.  Will have her continue doing stretching, stretches given to her.  She can also continue with massages, using heat.  Will also have her start Flexeril 10 mg 3 times a day as needed, discussed this may make her sleepy.  Follow-up if symptoms worsen or with any concerns.

## 2023-02-22 NOTE — Patient Instructions (Signed)
It was great to see you!  Start flexeril 1 tablet every 8 hours as needed for muscle spasms. This may make you sleepy.   Continue with the massages and stretching along with heat.   We are checking your labs today and will let you know the results via mychart/phone.   Let's follow-up in 6 months, sooner if you have concerns.  If a referral was placed today, you will be contacted for an appointment. Please note that routine referrals can sometimes take up to 3-4 weeks to process. Please call our office if you haven't heard anything after this time frame.  Take care,  Rodman Pickle, NP

## 2023-02-22 NOTE — Assessment & Plan Note (Signed)
A1c today 6.5%.  She has borderline diabetes at this point.  She has had bariatric surgery and has lost 40 pounds.  Will recheck A1c today.

## 2023-02-25 LAB — IRON,TIBC AND FERRITIN PANEL
%SAT: 28 % (ref 16–45)
Ferritin: 42 ng/mL (ref 16–154)
Iron: 78 ug/dL (ref 40–190)
TIBC: 276 ug/dL (ref 250–450)

## 2023-02-25 LAB — VITAMIN B1: Vitamin B1 (Thiamine): 10 nmol/L (ref 8–30)

## 2023-04-01 ENCOUNTER — Other Ambulatory Visit: Payer: Self-pay | Admitting: Nurse Practitioner

## 2023-04-05 ENCOUNTER — Encounter: Payer: BC Managed Care – PPO | Attending: General Surgery | Admitting: Dietician

## 2023-04-05 ENCOUNTER — Encounter: Payer: Self-pay | Admitting: Dietician

## 2023-04-05 DIAGNOSIS — E669 Obesity, unspecified: Secondary | ICD-10-CM | POA: Diagnosis not present

## 2023-04-05 NOTE — Progress Notes (Signed)
Bariatric Nutrition Follow-Up Visit Medical Nutrition Therapy  Appt Start Time: 11:21   End Time: 12:00  Surgery date: 12/17/2022 Surgery type: sleeve gastrectomy  NUTRITION ASSESSMENT   Surgery date: 12/17/2022 Surgery type: sleeve gastrectomy  Anthropometrics  Start weight at [facility]: 264.6 lbs (date: 06/15/2022)  Height: 63 in Weight today: 223.3 lbs   Clinical  Medical hx: Obesity, Anxiety, Arthritis Medications: Klonopin, Cosentyx, Prilosec, Vit D, Adderall, Lexapro, junel fe, armodafinil, methotrexate, doxepin, folic acid, gabapentin  Labs: Chol 227; triglycerides 150; Vit D 20.41; LDL 151: A1c 5.9; platelets 465.0 Notable signs/symptoms: none noted Any previous deficiencies? No Bowel Habits: diarrhea when taking magnesium   Body Composition Scale 01/01/2023 04/05/2023  Current Body Weight 253.6 223.3  Total Body Fat % 45.6 42.4  Visceral Fat 15 12  Fat-Free Mass % 54.3 57.5   Total Body Water % 41.6 43.2  Muscle-Mass lbs 31.9 31.6  BMI 44.5 39.2  Body Fat Displacement           Torso  lbs 71.6 58.6         Left Leg  lbs 14.3 11.7         Right Leg  lbs 14.3 11.7         Left Arm  lbs 7.1 5.8     Lifestyle & Dietary Hx  Pt states she feels great, stating her arthritis. Pt states her mental health is great.  Pt states she stopped taking magnesium citrate due to diarrhea. Pt states she has seen psychiatrist, stating she has cut back on her meds due to weight loss. Pt states she feels better and more clear headed. Pt states she needs to work on physical activity, stating she gets to work early and works late. Pt interested in the [facility] program.  Estimated daily fluid intake: 64+ oz Estimated daily protein intake: 60 g Supplements: multivitamin and calcium Current average weekly physical activity: ADLs   24-Hr Dietary Recall First Meal: coffee with protein powder (25 grams), powerade zero Snack: protein shake  Second Meal: kale salad, grilled chicken or deli meat,  pumpkins seeds, balsamic dressing Snack: blackberries  Third Meal: soup, chicken, broccoli or ground chicken tacos, lettuce,cheese, salsa verde Snack: none Beverages: water, coffee, powerade zero  Post-Op Goals/ Signs/ Symptoms Using straws: yes Drinking while eating: no Chewing/swallowing difficulties: no Changes in vision: no Changes to mood/headaches: no Hair loss/changes to skin/nails: no Difficulty focusing/concentrating: no Sweating: no Limb weakness: no Dizziness/lightheadedness: no Palpitations: no  Carbonated/caffeinated beverages: no N/V/D/C/Gas: diarrhea before stopping magnesium Abdominal pain: no Dumping syndrome: no    NUTRITION DIAGNOSIS  Overweight/obesity (Zeb-3.3) related to past poor dietary habits and physical inactivity as evidenced by completed bariatric surgery and following dietary guidelines for continued weight loss and healthy nutrition status.   NUTRITION INTERVENTION Nutrition counseling (C-1) and education (E-2) to facilitate bariatric surgery goals, including: Diet advancement to the standard prep plan The importance of consuming adequate calories as well as certain nutrients daily due to the body's need for essential vitamins, minerals, and fats The importance of daily physical activity and to reach a goal of at least 150 minutes of moderate to vigorous physical activity weekly (or as directed by their physician) due to benefits such as increased musculature and improved lab values The importance of intuitive eating specifically learning hunger-satiety cues and understanding the importance of learning a new body: The importance of mindful eating to avoid grazing behaviors   Goals Increase physical activity; check out [facility]; put on your vision board  Handouts Provided Include  Standard Prep Plan Advancement Guide  Learning Style & Readiness for Change Teaching method utilized: Visual & Auditory  Demonstrated degree of understanding via: Teach  Back  Readiness Level: Ready Barriers to learning/adherence to lifestyle change: busy work schedule  RD's Notes for Next Visit Assess adherence to pt chosen goals  MONITORING & EVALUATION Dietary intake, weekly physical activity, body weight.  Next Steps Patient is to follow-up in 3 months for 6 month post-op follow-up.

## 2023-04-12 DIAGNOSIS — R600 Localized edema: Secondary | ICD-10-CM | POA: Diagnosis not present

## 2023-04-12 DIAGNOSIS — L4059 Other psoriatic arthropathy: Secondary | ICD-10-CM | POA: Diagnosis not present

## 2023-04-12 DIAGNOSIS — G629 Polyneuropathy, unspecified: Secondary | ICD-10-CM | POA: Diagnosis not present

## 2023-04-12 DIAGNOSIS — Z111 Encounter for screening for respiratory tuberculosis: Secondary | ICD-10-CM | POA: Diagnosis not present

## 2023-04-12 DIAGNOSIS — R21 Rash and other nonspecific skin eruption: Secondary | ICD-10-CM | POA: Diagnosis not present

## 2023-05-20 ENCOUNTER — Other Ambulatory Visit: Payer: Self-pay | Admitting: Family

## 2023-05-20 ENCOUNTER — Encounter: Payer: Self-pay | Admitting: Nurse Practitioner

## 2023-05-20 ENCOUNTER — Telehealth: Payer: Self-pay

## 2023-05-20 MED ORDER — METHOCARBAMOL 500 MG PO TABS: 500.0000 mg | ORAL_TABLET | Freq: Three times a day (TID) | ORAL | 0 refills | Status: DC | PRN

## 2023-05-20 NOTE — Telephone Encounter (Signed)
I called and notified patient .  See previous phone encounter.

## 2023-05-20 NOTE — Telephone Encounter (Signed)
Copied from CRM [id number]. Topic: Clinical - Medication Question >> May 20, 2023 11:05 AM Irine Seal wrote: Reason for CRM: Pt called in, she sent message in Mychart, she is aware that Dr. Norval Morton is out of office, but she is liking her flexiril script, she states it makes her feel drunk even when taking 1/2 of the tablet, she is requesting something less "potent" she is asking for methocarbamol, that he and the provider had discussed prior

## 2023-05-20 NOTE — Telephone Encounter (Signed)
I called and spoke with patient and notified her that Robaxin was sent to her pharmacy.

## 2023-07-13 ENCOUNTER — Other Ambulatory Visit: Payer: Self-pay | Admitting: Family

## 2023-07-16 DIAGNOSIS — L4059 Other psoriatic arthropathy: Secondary | ICD-10-CM | POA: Diagnosis not present

## 2023-07-18 ENCOUNTER — Other Ambulatory Visit: Payer: Self-pay | Admitting: Nurse Practitioner

## 2023-08-23 ENCOUNTER — Ambulatory Visit: Payer: BC Managed Care – PPO | Admitting: Nurse Practitioner

## 2023-08-23 VITALS — BP 116/72 | HR 80 | Temp 97.1°F | Ht 63.0 in | Wt 227.0 lb

## 2023-08-23 DIAGNOSIS — Z23 Encounter for immunization: Secondary | ICD-10-CM | POA: Diagnosis not present

## 2023-08-23 DIAGNOSIS — I10 Essential (primary) hypertension: Secondary | ICD-10-CM

## 2023-08-23 DIAGNOSIS — Z9884 Bariatric surgery status: Secondary | ICD-10-CM | POA: Diagnosis not present

## 2023-08-23 DIAGNOSIS — E559 Vitamin D deficiency, unspecified: Secondary | ICD-10-CM | POA: Diagnosis not present

## 2023-08-23 DIAGNOSIS — G2581 Restless legs syndrome: Secondary | ICD-10-CM

## 2023-08-23 DIAGNOSIS — Z1159 Encounter for screening for other viral diseases: Secondary | ICD-10-CM

## 2023-08-23 DIAGNOSIS — Z0000 Encounter for general adult medical examination without abnormal findings: Secondary | ICD-10-CM

## 2023-08-23 DIAGNOSIS — R32 Unspecified urinary incontinence: Secondary | ICD-10-CM | POA: Diagnosis not present

## 2023-08-23 DIAGNOSIS — L4050 Arthropathic psoriasis, unspecified: Secondary | ICD-10-CM

## 2023-08-23 DIAGNOSIS — R7303 Prediabetes: Secondary | ICD-10-CM

## 2023-08-23 DIAGNOSIS — Z114 Encounter for screening for human immunodeficiency virus [HIV]: Secondary | ICD-10-CM | POA: Diagnosis not present

## 2023-08-23 DIAGNOSIS — M0600 Rheumatoid arthritis without rheumatoid factor, unspecified site: Secondary | ICD-10-CM

## 2023-08-23 DIAGNOSIS — F32A Depression, unspecified: Secondary | ICD-10-CM

## 2023-08-23 DIAGNOSIS — F419 Anxiety disorder, unspecified: Secondary | ICD-10-CM

## 2023-08-23 DIAGNOSIS — E782 Mixed hyperlipidemia: Secondary | ICD-10-CM

## 2023-08-23 LAB — VITAMIN D 25 HYDROXY (VIT D DEFICIENCY, FRACTURES): VITD: 27.84 ng/mL — ABNORMAL LOW (ref 30.00–100.00)

## 2023-08-23 LAB — COMPREHENSIVE METABOLIC PANEL WITH GFR
ALT: 13 U/L (ref 0–35)
AST: 12 U/L (ref 0–37)
Albumin: 4 g/dL (ref 3.5–5.2)
Alkaline Phosphatase: 55 U/L (ref 39–117)
BUN: 15 mg/dL (ref 6–23)
CO2: 27 meq/L (ref 19–32)
Calcium: 9.1 mg/dL (ref 8.4–10.5)
Chloride: 101 meq/L (ref 96–112)
Creatinine, Ser: 0.7 mg/dL (ref 0.40–1.20)
GFR: 109.25 mL/min (ref >60.00)
Glucose, Bld: 78 mg/dL (ref 70–99)
Potassium: 4.3 meq/L (ref 3.5–5.1)
Sodium: 136 meq/L (ref 135–145)
Total Bilirubin: 0.3 mg/dL (ref 0.2–1.2)
Total Protein: 6.8 g/dL (ref 6.0–8.3)

## 2023-08-23 LAB — LIPID PANEL
Cholesterol: 232 mg/dL — ABNORMAL HIGH (ref 0–200)
HDL: 44 mg/dL (ref >39.00)
LDL Cholesterol: 166 mg/dL — ABNORMAL HIGH (ref 0–99)
NonHDL: 187.9
Total CHOL/HDL Ratio: 5
Triglycerides: 111 mg/dL (ref 0.0–149.0)
VLDL: 22.2 mg/dL (ref 0.0–40.0)

## 2023-08-23 LAB — CBC WITH DIFFERENTIAL/PLATELET
Basophils Absolute: 0 10*3/uL (ref 0.0–0.1)
Basophils Relative: 0.5 % (ref 0.0–3.0)
Eosinophils Absolute: 0.1 10*3/uL (ref 0.0–0.7)
Eosinophils Relative: 0.9 % (ref 0.0–5.0)
HCT: 39.7 % (ref 36.0–46.0)
Hemoglobin: 13.3 g/dL (ref 12.0–15.0)
Lymphocytes Relative: 27.7 % (ref 12.0–46.0)
Lymphs Abs: 2.5 10*3/uL (ref 0.7–4.0)
MCHC: 33.4 g/dL (ref 30.0–36.0)
MCV: 100.1 fl — ABNORMAL HIGH (ref 78.0–100.0)
Monocytes Absolute: 0.5 10*3/uL (ref 0.1–1.0)
Monocytes Relative: 5 % (ref 3.0–12.0)
Neutro Abs: 6 10*3/uL (ref 1.4–7.7)
Neutrophils Relative %: 65.9 % (ref 43.0–77.0)
Platelets: 477 10*3/uL — ABNORMAL HIGH (ref 150.0–400.0)
RBC: 3.97 Mil/uL (ref 3.87–5.11)
RDW: 13.5 % (ref 11.5–15.5)
WBC: 9.2 10*3/uL (ref 4.0–10.5)

## 2023-08-23 LAB — TSH: TSH: 1.13 u[IU]/mL (ref 0.35–5.50)

## 2023-08-23 LAB — FERRITIN: Ferritin: 28.9 ng/mL (ref 10.0–291.0)

## 2023-08-23 LAB — VITAMIN B12: Vitamin B-12: 545 pg/mL (ref 211–911)

## 2023-08-23 NOTE — Assessment & Plan Note (Signed)
 She is doing well, able to eat without nausea or stomach pain.  Her incisions are healing well.  Will check BMP, CBC, vitamin B12, vitamin B1, vitamin D, TSH, ferritin today

## 2023-08-23 NOTE — Assessment & Plan Note (Signed)
Chronic, stable.  She currently follows with rheumatology and is taking methotrexate 2.5 mg once a week and Cosentyx 300 mg monthly.  Continue collaboration recommendation from rheumatology. 

## 2023-08-23 NOTE — Progress Notes (Signed)
 BP 116/72 (BP Location: Left Arm, Patient Position: Sitting, Cuff Size: Normal)   Pulse 80   Temp (!) 97.1 F (36.2 C)   Ht 5\' 3"  (1.6 m)   Wt 227 lb (103 kg)   SpO2 99%   BMI 40.21 kg/m    Subjective:    Patient ID: Rhonda Baker, female    DOB: Mar 09, 1986, 38 y.o.   MRN: [id number]  CC: Chief Complaint  Patient presents with   Annual Exam    With fasting lab work, concerns with bladder leakage    HPI: Rhonda Baker is Rhonda 38 y.o. female presenting on 08/23/2023 for comprehensive medical examination. Current medical complaints include: bladder leakage  For the last 3 months, she has noticed an odor in her urine. She also noticed that she had an episode of urinary incontinence one night. She sees her OBGYN next month. She denies urinary frequency, pelvic, pain, and dysuria. She was tested last month for Rhonda UTI which was negative.   She currently lives with: son Menopausal Symptoms: no  Depression and Anxiety Screen done today and results listed below:     08/23/2023   10:37 AM 02/22/2023   11:09 AM 08/31/2022    9:32 AM 08/31/2022    9:27 AM 06/15/2022    9:44 AM  Depression screen PHQ 2/9  Decreased Interest 0 0 0 0 0  Down, Depressed, Hopeless 0 0 0 0 0  PHQ - 2 Score 0 0 0 0 0  Altered sleeping 0 0  2   Tired, decreased energy 1 0  0   Change in appetite 1 0  0   Feeling bad or failure about yourself  1 0  0   Trouble concentrating 0 0  0   Moving slowly or fidgety/restless 0 0  2   Suicidal thoughts 0 0  0   PHQ-9 Score 3 0  4   Difficult doing work/chores Somewhat difficult Not difficult at all  Not difficult at all       08/23/2023   10:37 AM 02/22/2023   11:10 AM 08/31/2022    9:28 AM 05/23/2022   11:35 AM  GAD 7 : Generalized Anxiety Score  Nervous, Anxious, on Edge 1 0 0 0  Control/stop worrying 0 0 0 0  Worry too much - different things 0 0 0 1  Trouble relaxing 0 3 0 1  Restless 0 0 0 0  Easily annoyed or irritable 0 0 0 0  Afraid - awful might happen 0 0 0  0  Total GAD 7 Score 1 3 0 2  Anxiety Difficulty Somewhat difficult Very difficult Not difficult at all Somewhat difficult    The patient does not have Rhonda history of falls. I did not complete Rhonda risk assessment for falls. Rhonda plan of care for falls was not documented.   Past Medical History:  Past Medical History:  Diagnosis Date   Allergies    Allergy 2002   Anxiety    Phreesia 11/25/2019   Depression    [employer]reesia 11/25/2019   GERD (gastroesophageal reflux disease) Last year   Lately, I have been waking up choking on acid   Prediabetes    Psoriatic arthritis (HCC)    RA (rheumatoid arthritis) (HCC)    SERONEGATIVE    Surgical History:  Past Surgical History:  Procedure Laterality Date   CESAREAN SECTION     2011   COLPOSCOPY     COSMETIC SURGERY  2017  Breast lift   LAPAROSCOPIC GASTRIC SLEEVE RESECTION N/Rhonda 12/17/2022   Procedure: LAPAROSCOPIC GASTRIC SLEEVE RESECTION;  Surgeon: Sheliah Hatch, De Blanch, MD;  Location: [facility];  Service: General;  Laterality: N/Rhonda;   LAPAROSCOPY  2008   GYN   LEEP     UPPER GI ENDOSCOPY N/Rhonda 12/17/2022   Procedure: UPPER GI ENDOSCOPY;  Surgeon: Rodman Pickle, MD;  Location: [facility];  Service: General;  Laterality: N/Rhonda;    Medications:  Current Outpatient Medications on File Prior to Visit  Medication Sig   acetaminophen (TYLENOL) 500 MG tablet Take 500-1,000 mg by mouth every 6 (six) hours as needed (pain.).   Armodafinil 250 MG tablet Take 125 mg by mouth daily as needed (alertness).   clonazePAM (KLONOPIN) 1 MG tablet Take 1 mg by mouth 3 (three) times daily as needed for anxiety.   COSENTYX SENSOREADY, 300 MG, 150 MG/ML SOAJ Inject 2 Syringes into the skin every 28 (twenty-eight) days.   doxepin (SINEQUAN) 75 MG capsule Take 150 mg by mouth at bedtime.   escitalopram (LEXAPRO) 20 MG tablet Take 1 tablet (20 mg total) by mouth daily. (Patient taking differently: Take 40 mg by mouth in the morning.)   folic acid (FOLVITE) 1 MG tablet  Take 1 mg by mouth every evening.   gabapentin (NEURONTIN) 300 MG capsule Take 300 mg by mouth as needed (pain. Takes 1 300mg  capsule as needed).   Homeopathic Products (LEG CRAMPS PO) Take 2 tablets by mouth 3 (three) times daily as needed (leg cramps).   ibuprofen (ADVIL) 200 MG tablet Take 600 mg by mouth every 8 (eight) hours as needed (pain.).   JUNEL FE 1/20 1-20 MG-MCG tablet Take 1 tablet by mouth at bedtime.   methocarbamol (ROBAXIN) 500 MG tablet TAKE 1 TABLET BY MOUTH EVERY 8 HOURS AS NEEDED FOR MUSCLE SPASMS.   methotrexate (RHEUMATREX) 2.5 MG tablet Take 15 mg by mouth every Friday.   methylphenidate (RITALIN) 20 MG tablet Take 20 mg by mouth 2 (two) times daily with breakfast and lunch.   ondansetron (ZOFRAN-ODT) 4 MG disintegrating tablet Take 1 tablet (4 mg) by mouth every 6 hours as needed for nausea or vomiting.   pantoprazole (PROTONIX) 40 MG tablet Take 1 tablet (40 mg total) by mouth daily.   rOPINIRole (REQUIP) 0.5 MG tablet Take 1 tablet (0.5 mg total) by mouth at bedtime. Take with requip 1mg  to equal 1.5mg    rOPINIRole (REQUIP) 1 MG tablet Take 1 mg by mouth at bedtime.   No current facility-administered medications on file prior to visit.    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   Tobacco Use   Smoking status: Never   Smokeless tobacco: Never  Vaping Use   Vaping status: Never Used  Substance and Sexual Activity   Alcohol use: Not Currently   Drug use: Never   Sexual activity: Not Currently    Birth control/protection: Pill  Other Topics Concern   Not on file  Social History Narrative    Lives with son Rhonda Baker   Rhonda Baker and Rhonda Baker      Enjoy: hanging out with son,  friends, going       Diet: eats all food groups    Caffeine: 1 cup of coffee Rhonda day   Water: 6-8 cups daily       Wears seat belt    Does not use phone while driving    Smoke detectors at home   Weapon in [employer] box       Right handed   Social Drivers of Health   Financial Resource Strain: Low Risk  (08/23/2023)   Overall Financial Resource Strain (CARDIA)    Difficulty of Paying Living Expenses: Not very hard  Food Insecurity: No Food Insecurity (08/23/2023)   Hunger Vital Sign    Worried About Running Out of Food in the Last Year: Never true    Ran Out of Food in the Last Year: Never true  Transportation Needs: No Transportation Needs (08/23/2023)   PRAPARE - Administrator, Civil Service (Medical): No    Lack of Transportation (Non-Medical): No  Physical Activity: Insufficiently Active (08/23/2023)   Exercise Vital Sign    Days of Exercise per Week: 3 days    Minutes of Exercise per Session: 30 min  Stress: No Stress Concern Present (08/23/2023)   [employer] of Occupational Health - Occupational Stress Questionnaire    Feeling of Stress : Not at all  Social Connections: Moderately Integrated (08/23/2023)   Social Connection and Isolation Panel [NHANES]    Frequency of Communication with Friends and Family: More than three times Rhonda week    Frequency of Social Gatherings with Friends and Family: More than three times Rhonda week    Attends Religious Services: More than 4 times per year    Active Member of [employer] or Organizations: Yes    Attends Engineer, structural: More than 4 times per year    Marital Status: Divorced  Catering manager Violence: Not At Risk (12/17/2022)   Humiliation, Afraid, Rape, and Kick questionnaire    Fear of Current or Ex-Partner: No    Emotionally Abused: No    Physically Abused: No    Sexually Abused: No   Social History    Tobacco Use  Smoking Status Never  Smokeless Tobacco Never   Social History   Substance and Sexual Activity  Alcohol Use Not Currently    Family History:  Family History  Problem Relation Age of Onset   Glaucoma Mother    Anxiety disorder Mother    Depression Mother    Diabetes Father    Hyperlipidemia Father    Hearing loss Father    Hypertension Father    Healthy Son    Lupus Cousin    Celiac disease Cousin    Neuropathy Maternal Grandfather    Cancer Maternal Grandfather    Diabetes Maternal Grandfather    Multiple sclerosis Cousin        paternal 1st cousin   Anxiety disorder Maternal Grandmother    Arthritis Maternal Grandmother    Depression Maternal Grandmother    Alcohol abuse Paternal Grandfather    Cancer Paternal Grandfather    Cancer Paternal Grandmother    Cancer Paternal Aunt    Cancer Paternal Uncle     Past medical history, surgical history, medications, allergies, family history and social history reviewed with patient today and changes made to appropriate areas of the chart.   Review of Systems  Constitutional: Negative.   HENT: Negative.    Eyes: Negative.   Respiratory: Negative.    Cardiovascular: Negative.   Gastrointestinal: Negative.  Genitourinary:  Negative for dysuria, frequency and urgency.       Incontinence   Musculoskeletal: Negative.   Skin: Negative.   Neurological: Negative.   Psychiatric/Behavioral: Negative.     All other ROS negative except what is listed above and in the HPI.      Objective:    BP 116/72 (BP Location: Left Arm, Patient Position: Sitting, Cuff Size: Normal)   Pulse 80   Temp (!) 97.1 F (36.2 C)   Ht 5\' 3"  (1.6 m)   Wt 227 lb (103 kg)   SpO2 99%   BMI 40.21 kg/m   Wt Readings from Last 3 Encounters:  08/23/23 227 lb (103 kg)  02/22/23 230 lb 12.8 oz (104.7 kg)  01/01/23 253 lb 9.6 oz (115 kg)    Physical Exam Vitals and nursing note reviewed.  Constitutional:      General: She is not  in acute distress.    Appearance: Normal appearance.  HENT:     Head: Normocephalic and atraumatic.     Right Ear: Tympanic membrane, ear canal and external ear normal.     Left Ear: Tympanic membrane, ear canal and external ear normal.     Mouth/Throat:     Mouth: Mucous membranes are moist.     Pharynx: No posterior oropharyngeal erythema.  Eyes:     Conjunctiva/sclera: Conjunctivae normal.  Cardiovascular:     Rate and Rhythm: Normal rate and regular rhythm.     Pulses: Normal pulses.     Heart sounds: Normal heart sounds.  Pulmonary:     Effort: Pulmonary effort is normal.     Breath sounds: Normal breath sounds.  Abdominal:     Palpations: Abdomen is soft.     Tenderness: There is no abdominal tenderness.  Musculoskeletal:        General: Normal range of motion.     Cervical back: Normal range of motion and neck supple.     Right lower leg: No edema.     Left lower leg: No edema.  Lymphadenopathy:     Cervical: No cervical adenopathy.  Skin:    General: Skin is warm and dry.  Neurological:     General: No focal deficit present.     Mental Status: She is alert and oriented to person, place, and time.     Cranial Nerves: No cranial nerve deficit.     Coordination: Coordination normal.     Gait: Gait normal.  Psychiatric:        Mood and Affect: Mood normal.        Behavior: Behavior normal.        Thought Content: Thought content normal.        Judgment: Judgment normal.     Results for orders placed or performed in visit on 02/22/23  CBC   Collection Time: 02/22/23 10:58 AM  Result Value Ref Range   WBC 8.6 4.0 - 10.5 K/uL   RBC 4.10 3.87 - 5.11 Mil/uL   Platelets 465.0 (H) 150.0 - 400.0 K/uL   Hemoglobin 12.9 12.0 - 15.0 g/dL   HCT 16.1 09.6 - 04.5 %   MCV 97.2 78.0 - 100.0 fl   MCHC 32.5 30.0 - 36.0 g/dL   RDW 40.9 81.1 - 91.4 %  VITAMIN D 25 Hydroxy (Vit-D Deficiency, Fractures)   Collection Time: 02/22/23 10:58 AM  Result Value Ref Range   VITD 36.88  30.00 - 100.00 ng/mL  Vitamin B12   Collection Time: 02/22/23 10:58 AM  Result Value Ref Range   Vitamin B-12 792 211 - 911 pg/mL  Vitamin B1   Collection Time: 02/22/23 10:58 AM  Result Value Ref Range   Vitamin B1 (Thiamine) 10 8 - 30 nmol/L  Basic metabolic panel   Collection Time: 02/22/23 10:58 AM  Result Value Ref Range   Sodium 137 135 - 145 mEq/L   Potassium 4.0 3.5 - 5.1 mEq/L   Chloride 103 96 - 112 mEq/L   CO2 24 19 - 32 mEq/L   Glucose, Bld 89 70 - 99 mg/dL   BUN 14 6 - 23 mg/dL   Creatinine, Ser 4.09 0.40 - 1.20 mg/dL   GFR 81.19 >14.78 mL/min   Calcium 9.7 8.4 - 10.5 mg/dL  Iron, TIBC and Ferritin Panel   Collection Time: 02/22/23 10:58 AM  Result Value Ref Range   Iron 78 40 - 190 mcg/dL   TIBC 295 621 - 308 mcg/dL (calc)   %SAT 28 16 - 45 % (calc)   Ferritin 42 16 - 154 ng/mL  TSH   Collection Time: 02/22/23 10:58 AM  Result Value Ref Range   TSH 1.17 0.35 - 5.50 uIU/mL  Hemoglobin A1c   Collection Time: 02/22/23 10:58 AM  Result Value Ref Range   Hgb A1c MFr Bld 5.9 4.6 - 6.5 %      Assessment & Plan:   Problem List Items Addressed This Visit       Cardiovascular and Mediastinum   Essential hypertension   Chronic, stable.  It is currently controlled without medication.  Check BMP, CBC, lipid panel today.        Musculoskeletal and Integument   Psoriatic arthritis (HCC)   Chronic, stable.  She currently follows with rheumatology and is taking methotrexate 2.5 mg once Rhonda week and Cosentyx 300 mg monthly.  Continue collaboration recommendation from rheumatology.      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  She currently follows with rheumatology and is taking methotrexate 2.5 mg once Rhonda week and Cosentyx 300 mg monthly.  Continue collaboration recommendation from rheumatology.        Other   Vitamin D deficiency   Will check vitamin D levels and adjust regimen based on results.      Relevant Orders    VITAMIN D 25 Hydroxy (Vit-D Deficiency, Fractures)   Anxiety and depression   Chronic, stable.  She is currently following with psychiatry.  Will have her continue her current regimen in collaboration with specialist.      Morbid obesity (HCC)   BMI is 40.2 She is continuing to lose weight after bariatric surgery.       Relevant Medications   methylphenidate (RITALIN) 20 MG tablet   Prediabetes   Chronic, stable. Check A1c and treat based on results.       Relevant Orders   Hemoglobin A1c   Restless leg syndrome   Chronic, stable. She is not currently having symptoms and would like to come off requip. Will have her decrease to 1mg  at bedtime for 2 weeks, then 0.5mg  at bedtime for 2 weeks, then stop.       History of bariatric surgery   She is doing well, able to eat without nausea or stomach pain.  Her incisions are healing well.  Will check BMP, CBC, vitamin B12, vitamin B1, vitamin D, TSH, ferritin today      Relevant Orders   CBC with Differential/Platelet   Comprehensive metabolic panel with GFR  Hemoglobin A1c   Vitamin B12   TSH   Ferritin   Routine general medical examination at Rhonda health care facility - [facility]   Health maintenance reviewed and updated. Discussed nutrition, exercise. Follow-up 1 year.        Urinary incontinence   Discussed starting pelvic floor exercises and keep upcoming appointment with GYN.       Mixed hyperlipidemia   Chronic, stable. Check CMP, CBC, lipid panel and treat based on results.       Relevant Orders   CBC with Differential/Platelet   Comprehensive metabolic panel with GFR   Lipid panel   Other Visit Diagnoses       Screening for HIV (human immunodeficiency virus)       Screen HIV today   Relevant Orders   HIV Antibody (routine testing w rflx)     Encounter for hepatitis C screening test for low risk patient       Screen hepatitis C today   Relevant Orders   Hepatitis C antibody     Immunization due       Tdap given  today   Relevant Orders   Tdap vaccine greater than or equal to 7yo IM (Completed)        Follow up plan: Return in about 1 year (around 08/22/2024) for CPE.   LABORATORY TESTING:  - Pap smear: up to date  IMMUNIZATIONS:   - Tdap: Tetanus vaccination status reviewed: Td vaccination indicated and given today. - Influenza: Up to date - Pneumovax: Not applicable - Prevnar: Not applicable - HPV: Not applicable - Shingrix vaccine: Not applicable  SCREENING: -Mammogram: Not applicable  - Colonoscopy: Not applicable  - Bone Density: Not applicable   PATIENT COUNSELING:   Advised to take 1 mg of folate supplement per day if capable of pregnancy.   Sexuality: Discussed sexually transmitted diseases, partner selection, use of condoms, avoidance of unintended pregnancy  and contraceptive alternatives.   Advised to avoid cigarette smoking.  I discussed with the patient that most people either abstain from alcohol or drink within safe limits (<=14/week and <=4 drinks/occasion for males, <=7/weeks and <= 3 drinks/occasion for females) and that the risk for alcohol disorders and other health effects rises proportionally with the number of drinks per week and how often Rhonda drinker exceeds daily limits.  Discussed cessation/primary prevention of drug use and availability of treatment for abuse.   Diet: Encouraged to adjust caloric intake to maintain  or achieve ideal body weight, to reduce intake of dietary saturated fat and total fat, to limit sodium intake by avoiding high sodium foods and not adding table salt, and to maintain adequate dietary potassium and calcium preferably from fresh fruits, vegetables, and low-fat dairy products.    stressed the importance of regular exercise  Injury prevention: Discussed safety belts, safety helmets, smoke detector, smoking near bedding or upholstery.   Dental health: Discussed importance of regular tooth brushing, flossing, and dental visits.     NEXT PREVENTATIVE PHYSICAL DUE IN 1 YEAR. Return in about 1 year (around 08/22/2024) for CPE.  Rhonda Baker Rhonda Baker

## 2023-08-23 NOTE — Assessment & Plan Note (Signed)
 Chronic, stable. She is not currently having symptoms and would like to come off requip. Will have her decrease to 1mg  at bedtime for 2 weeks, then 0.5mg  at bedtime for 2 weeks, then stop.

## 2023-08-23 NOTE — Assessment & Plan Note (Signed)
 Chronic, stable. Check A1c and treat based on results.

## 2023-08-23 NOTE — Assessment & Plan Note (Signed)
Chronic, stable. Check CMP, CBC, lipid panel and treat based on results.

## 2023-08-23 NOTE — Assessment & Plan Note (Signed)
 BMI is 40.2 She is continuing to lose weight after bariatric surgery.

## 2023-08-23 NOTE — Patient Instructions (Addendum)
 It was great to see you!  We are checking your labs today and will let you know the results via mychart/phone.   Start attached pelvic floor exercises   Keep appointment with GYN next month.   Try tylenol arthritis for your back pain  Decrease your requip to 1mg  at night for 2 weeks, then 0.5mg  at night for 2 weeks, then stop.   Let's follow-up in 1 year, sooner if you have concerns.  If a referral was placed today, you will be contacted for an appointment. Please note that routine referrals can sometimes take up to 3-4 weeks to process. Please call our office if you haven't heard anything after this time frame.  Take care,  Rodman Pickle, NP

## 2023-08-23 NOTE — Assessment & Plan Note (Signed)
 Discussed starting pelvic floor exercises and keep upcoming appointment with GYN.

## 2023-08-23 NOTE — Assessment & Plan Note (Signed)
Will check vitamin D levels and adjust regimen based on results.

## 2023-08-23 NOTE — Assessment & Plan Note (Signed)
Chronic, stable.  She is currently following with psychiatry.  Will have her continue her current regimen in collaboration with specialist.

## 2023-08-23 NOTE — Assessment & Plan Note (Signed)
 Chronic, stable.  It is currently controlled without medication.  Check BMP, CBC, lipid panel today.

## 2023-08-23 NOTE — Assessment & Plan Note (Signed)
Health maintenance reviewed and updated. Discussed nutrition, exercise. Follow-up 1 year.

## 2023-08-24 LAB — HIV ANTIBODY (ROUTINE TESTING W REFLEX): HIV 1&2 Ab, 4th Generation: NONREACTIVE

## 2023-08-24 LAB — HEPATITIS C ANTIBODY: Hepatitis C Ab: NONREACTIVE

## 2023-08-24 LAB — HEMOGLOBIN A1C: Hgb A1c MFr Bld: 5.9 % (ref 4.6–6.5)

## 2023-08-26 ENCOUNTER — Encounter: Payer: Self-pay | Admitting: Nurse Practitioner

## 2023-08-31 ENCOUNTER — Other Ambulatory Visit: Payer: Self-pay | Admitting: Nurse Practitioner

## 2023-09-02 NOTE — Telephone Encounter (Signed)
 Requesting: ROPINIROLE HCL 0.5 MG TABLET  Last Visit: 08/23/2023 Next Visit: 09/04/2024 Last Refill: 02/22/2023  Please Advise

## 2023-09-04 ENCOUNTER — Encounter: Payer: Self-pay | Admitting: Nurse Practitioner

## 2023-09-04 MED ORDER — PANTOPRAZOLE SODIUM 40 MG PO TBEC: 40.0000 mg | DELAYED_RELEASE_TABLET | Freq: Every day | ORAL | 1 refills | Status: DC

## 2023-09-04 MED ORDER — METHOCARBAMOL 500 MG PO TABS: 500.0000 mg | ORAL_TABLET | Freq: Three times a day (TID) | ORAL | 0 refills | Status: AC | PRN

## 2023-09-04 NOTE — Telephone Encounter (Signed)
 Requesting: Pantoprazole 40mg ,  Methocaramol 500mg  Last Visit: 08/23/2023 Next Visit: 09/04/2024 Last Refill: 02/22/2023, 07/18/2023  Please Advise

## 2023-09-30 DIAGNOSIS — F900 Attention-deficit hyperactivity disorder, predominantly inattentive type: Secondary | ICD-10-CM | POA: Diagnosis not present

## 2023-10-11 DIAGNOSIS — G5601 Carpal tunnel syndrome, right upper limb: Secondary | ICD-10-CM | POA: Diagnosis not present

## 2023-10-11 DIAGNOSIS — G629 Polyneuropathy, unspecified: Secondary | ICD-10-CM | POA: Diagnosis not present

## 2023-10-11 DIAGNOSIS — L4059 Other psoriatic arthropathy: Secondary | ICD-10-CM | POA: Diagnosis not present

## 2023-10-11 DIAGNOSIS — R110 Nausea: Secondary | ICD-10-CM | POA: Diagnosis not present

## 2023-10-18 DIAGNOSIS — Z01419 Encounter for gynecological examination (general) (routine) without abnormal findings: Secondary | ICD-10-CM | POA: Diagnosis not present

## 2023-10-18 DIAGNOSIS — Z124 Encounter for screening for malignant neoplasm of cervix: Secondary | ICD-10-CM | POA: Diagnosis not present

## 2023-11-01 DIAGNOSIS — M79604 Pain in right leg: Secondary | ICD-10-CM | POA: Diagnosis not present

## 2023-11-01 DIAGNOSIS — M5459 Other low back pain: Secondary | ICD-10-CM | POA: Diagnosis not present

## 2023-11-01 DIAGNOSIS — R202 Paresthesia of skin: Secondary | ICD-10-CM | POA: Diagnosis not present

## 2023-11-08 DIAGNOSIS — M79604 Pain in right leg: Secondary | ICD-10-CM | POA: Diagnosis not present

## 2023-11-08 DIAGNOSIS — M5459 Other low back pain: Secondary | ICD-10-CM | POA: Diagnosis not present

## 2023-11-08 DIAGNOSIS — R202 Paresthesia of skin: Secondary | ICD-10-CM | POA: Diagnosis not present

## 2024-01-03 DIAGNOSIS — L4059 Other psoriatic arthropathy: Secondary | ICD-10-CM | POA: Diagnosis not present

## 2024-02-07 DIAGNOSIS — Z79899 Other long term (current) drug therapy: Secondary | ICD-10-CM | POA: Diagnosis not present

## 2024-02-23 ENCOUNTER — Other Ambulatory Visit: Payer: Self-pay | Admitting: Nurse Practitioner

## 2024-02-25 NOTE — Telephone Encounter (Signed)
 Requesting: ROPINIROLE  HCL 0.5 MG TABLET  Last Visit: 08/23/2023 Next Visit: 09/04/2024 Last Refill: 09/02/2023  Please Advise

## 2024-02-27 ENCOUNTER — Other Ambulatory Visit: Payer: Self-pay | Admitting: Nurse Practitioner

## 2024-02-27 NOTE — Telephone Encounter (Signed)
 Requesting: PANTOPRAZOLE  SOD DR 40 MG TAB  Last Visit: 08/23/2023 Next Visit: 09/04/2024 Last Refill: 09/24/2023  Please Advise

## 2024-04-10 DIAGNOSIS — G629 Polyneuropathy, unspecified: Secondary | ICD-10-CM | POA: Diagnosis not present

## 2024-04-10 DIAGNOSIS — R21 Rash and other nonspecific skin eruption: Secondary | ICD-10-CM | POA: Diagnosis not present

## 2024-04-10 DIAGNOSIS — L4059 Other psoriatic arthropathy: Secondary | ICD-10-CM | POA: Diagnosis not present

## 2024-04-10 DIAGNOSIS — R600 Localized edema: Secondary | ICD-10-CM | POA: Diagnosis not present

## 2024-05-06 DIAGNOSIS — F4311 Post-traumatic stress disorder, acute: Secondary | ICD-10-CM | POA: Diagnosis not present

## 2024-05-06 DIAGNOSIS — F321 Major depressive disorder, single episode, moderate: Secondary | ICD-10-CM | POA: Diagnosis not present

## 2024-05-15 DIAGNOSIS — F4311 Post-traumatic stress disorder, acute: Secondary | ICD-10-CM | POA: Diagnosis not present

## 2024-05-15 DIAGNOSIS — F321 Major depressive disorder, single episode, moderate: Secondary | ICD-10-CM | POA: Diagnosis not present

## 2024-09-04 ENCOUNTER — Encounter: Admitting: Nurse Practitioner
